# Patient Record
Sex: Female | Born: 2000 | Race: White | Hispanic: No | Marital: Married | Smoking: Never smoker
Health system: Southern US, Community
[De-identification: ages and names within clinical notes are randomized; demographics above are authoritative.]

## PROBLEM LIST (undated history)

## (undated) DIAGNOSIS — E559 Vitamin D deficiency, unspecified: Secondary | ICD-10-CM

## (undated) DIAGNOSIS — F909 Attention-deficit hyperactivity disorder, unspecified type: Secondary | ICD-10-CM

## (undated) DIAGNOSIS — K429 Umbilical hernia without obstruction or gangrene: Secondary | ICD-10-CM

## (undated) DIAGNOSIS — O1490 Unspecified pre-eclampsia, unspecified trimester: Secondary | ICD-10-CM

## (undated) DIAGNOSIS — G43109 Migraine with aura, not intractable, without status migrainosus: Secondary | ICD-10-CM

## (undated) DIAGNOSIS — D649 Anemia, unspecified: Secondary | ICD-10-CM

## (undated) HISTORY — DX: Unspecified pre-eclampsia, unspecified trimester: O14.90

## (undated) HISTORY — DX: Attention-deficit hyperactivity disorder, unspecified type: F90.9

## (undated) HISTORY — DX: Migraine with aura, not intractable, without status migrainosus: G43.109

---
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
## 2018-02-24 ENCOUNTER — Other Ambulatory Visit: Payer: Self-pay | Admitting: Obstetrics and Gynecology

## 2018-02-24 DIAGNOSIS — Z30016 Encounter for initial prescription of transdermal patch hormonal contraceptive device: Secondary | ICD-10-CM

## 2018-03-06 ENCOUNTER — Encounter: Payer: Self-pay | Admitting: Podiatry

## 2018-03-06 ENCOUNTER — Ambulatory Visit: Payer: BLUE CROSS/BLUE SHIELD | Admitting: Podiatry

## 2018-03-06 DIAGNOSIS — L600 Ingrowing nail: Secondary | ICD-10-CM

## 2018-03-06 MED ORDER — NEOMYCIN-POLYMYXIN-HC 1 % OT SOLN: OTIC | 1 refills | Status: DC

## 2018-03-06 NOTE — Patient Instructions (Signed)

## 2018-03-06 NOTE — Progress Notes (Signed)
  Subjective:  Patient ID: Karen Woodward, female    DOB: 04-15-2001,  MRN: [id number] HPI Chief Complaint  Patient presents with  . Ingrown Toenail    Patient presents today for ingrown toenail left hallux medial border x 1 month.  She reports her toe is sore, puffy and draining.  she has been soaking in Epson salt and cutting nail out for treatment    17 y.o. female presents with the above complaint.   ROS: Denies fever chills nausea vomiting muscle aches pains calf pain back pain chest pain shortness of breath.  Past Medical History:  Diagnosis Date  . ADHD (attention deficit hyperactivity disorder)    Past Surgical History:  Procedure Laterality Date  . TONSILLECTOMY AND ADENOIDECTOMY  06/2014    Current Outpatient Medications:  .  promethazine (PHENERGAN) 25 MG tablet, Take by mouth., Disp: , Rfl:  .  propranolol ER (INDERAL LA) 60 MG 24 hr capsule, Take by mouth., Disp: , Rfl:  .  NEOMYCIN-POLYMYXIN-HYDROCORTISONE (CORTISPORIN) 1 % SOLN OTIC solution, Apply 1-2 drops to toe bid after soaking, Disp: 10 mL, Rfl: 1  No Known Allergies Review of Systems Objective:  There were no vitals filed for this visit.  General: Well developed, nourished, in no acute distress, alert and oriented x3   Dermatological: Skin is warm, dry and supple bilateral. Nails x 10 are well maintained; remaining integument appears unremarkable at this time. There are no open sores, no preulcerative lesions, no rash or signs of infection present.  Vascular: Dorsalis Pedis artery and Posterior Tibial artery pedal pulses are 2/4 bilateral with immedate capillary fill time. Pedal hair growth present. No varicosities and no lower extremity edema present bilateral.   Neruologic: Grossly intact via light touch bilateral. Vibratory intact via tuning fork bilateral. Protective threshold with Semmes Wienstein monofilament intact to all pedal sites bilateral. Patellar and Achilles deep tendon reflexes 2+ bilateral.  No Babinski or clonus noted bilateral.   Musculoskeletal: No gross boney pedal deformities bilateral. No pain, crepitus, or limitation noted with foot and ankle range of motion bilateral. Muscular strength 5/5 in all groups tested bilateral.  Gait: Unassisted, Nonantalgic.    Radiographs:  None taken  Assessment & Plan:   Assessment: Ingrown toenail tibial and fibular border of the hallux left.  Plan: Matrixectomy's were performed to the hallux left after local anesthesia was administered.  Both tibiofibular border were excised and phenol was applied to the matrix to inhibit growth.  She was given both oral and written home-going instructions for the care and soaking of her toe as well as prescription for Cortisporin Otic to be applied twice daily after soaking.  I will follow-up with her in 2 weeks remember to ask how her trip was.     Max T. [place], [place]

## 2018-03-25 ENCOUNTER — Ambulatory Visit: Payer: Self-pay | Admitting: Podiatry

## 2018-05-13 ENCOUNTER — Ambulatory Visit (INDEPENDENT_AMBULATORY_CARE_PROVIDER_SITE_OTHER): Payer: BLUE CROSS/BLUE SHIELD | Admitting: Obstetrics & Gynecology

## 2018-05-13 ENCOUNTER — Encounter: Payer: Self-pay | Admitting: Obstetrics & Gynecology

## 2018-05-13 VITALS — BP 100/60 | Ht 63.0 in | Wt 213.0 lb

## 2018-05-13 DIAGNOSIS — N926 Irregular menstruation, unspecified: Secondary | ICD-10-CM | POA: Diagnosis not present

## 2018-05-13 MED ORDER — NORETHIN-ETH ESTRAD-FE BIPHAS 1 MG-10 MCG / 10 MCG PO TABS: 1.0000 | ORAL_TABLET | Freq: Every day | ORAL | 3 refills | Status: DC

## 2018-05-13 NOTE — Patient Instructions (Signed)
Estradiol; Norethindrone Acetate tablets What is this medicine? ESTRADIOL; NORETHINDRONE (es tra DYE ole; nor eth IN drone) is used as hormone replacement in menopausal women who still have their uterus. This product helps to treat hot flashes and prevent osteoporosis (weak bones). This medicine may be used for other purposes; ask your health care provider or pharmacist if you have questions. COMMON BRAND NAME(S): Activella, Danie ChandlerAmabelz, Lopreeza, Mimvey, Mimvey Lo What should I tell my health care provider before I take this medicine? They need to know if you have any of these conditions: -blood vessel disease or blood clots -breast, cervical, endometrial, or uterine cancer -diabetes -endometriosis -fibroids -gallbladder disease -heart disease or recent heart attack -high blood cholesterol -high blood pressure -high level of calcium in the blood -hysterectomy -kidney disease -liver disease -mental depression -migraine headaches -porphyria -stroke -systemic lupus erythematosus (SLE) -tobacco smoker -vaginal bleeding -an unusual or allergic reaction to estrogens, progestins, other medicines, foods, dyes, or preservatives -pregnant or trying to get pregnant -breast-feeding How should I use this medicine? Take this medicine by mouth with a drink of water. You may take this medicine with food. Follow the directions on the prescription label. You will take one tablet daily at roughly the same time each day. Do not take your medicine more often than directed. A patient package insert for the product will be given with each prescription and refill. Read this sheet carefully each time. The sheet may change frequently. Talk to your pediatrician regarding the use of this medicine in children. Special care may be needed. Overdosage: If you think you have taken too much of this medicine contact a poison control center or emergency room at once. NOTE: This medicine is only for you. Do not share  this medicine with others. What if I miss a dose? If you miss a dose, take it as soon as you can. If it is almost time for your next dose, take only that dose. Do not take double or extra doses. What may interact with this medicine? Do not take this medicine with any of the following medications: -aromatase inhibitors like aminoglutethimide, anastrozole, exemestane, letrozole, testolactone This medicine may also interact with the following medications: -barbiturates, such as phenobarbital -benzodiazepines -bosentan -bromocriptine -carbamazepine -cimetidine -cyclosporine -dantrolene -grapefruit juice -griseofulvin -hydrocortisone, cortisone, or prednisolone -isoniazid (INH) -medications for diabetes -methotrexate -mineral oil -phenytoin -raloxifene -rifabutin, rifampin, or rifapentine -tamoxifen -thyroid hormones -topiramate -tricyclic antidepressants -warfarin This list may not describe all possible interactions. Give your health care provider a list of all the medicines, herbs, non-prescription drugs, or dietary supplements you use. Also tell them if you smoke, drink alcohol, or use illegal drugs. Some items may interact with your medicine. What should I watch for while using this medicine? Visit your health care professional for regular checks on your progress. You should have a complete check-up every 6 months. You will need a regular breast and pelvic exam. You should also discuss the need for regular mammograms with your health care professional, and follow his or her guidelines. This medicine can make your body retain fluid, making your fingers, hands, or ankles swell. Your blood pressure can go up. Contact your doctor or health care professional if you feel you are retaining fluid. If you have any reason to think you are pregnant; stop taking this medicine at once and contact your doctor or health care professional. Tobacco smoking increases the risk of getting a blood clot  or having a stroke, especially if you are more than 19 years  old. You are strongly advised not to smoke. If you wear contact lenses and notice visual changes, or if the lenses begin to feel uncomfortable, consult your eye care specialist. If you are going to have elective surgery, you may need to stop taking this medicine beforehand. Consult your health care professional for advice prior to scheduling the surgery. What side effects may I notice from receiving this medicine? Side effects that you should report to your doctor or health care professional as soon as possible: -allergic reactions like skin rash, itching or hives, swelling of the face, lips, or tongue -breast tissue changes or discharge -changes in vision -chest pain -confusion, trouble speaking or understanding -dark urine -general ill feeling or flu-like symptoms -light-colored stools -nausea, vomiting -pain, swelling, warmth in the leg -right upper belly pain -severe headaches -shortness of breath -sudden numbness or weakness of the face, arm or leg -trouble walking, dizziness, loss of balance or coordination -unusual vaginal bleeding -yellowing of the eyes or skin Side effects that usually do not require medical attention (report to your doctor or health care professional if they continue or are bothersome): -acne -brown spots on the face -change in appetite -change in sexual desire -depressed mood or mood swings -fluid retention and swelling -stomach cramps or bloating -unusually weak or tired -weight gain This list may not describe all possible side effects. Call your doctor for medical advice about side effects. You may report side effects to FDA at 1-800-FDA-1088. Where should I keep my medicine? Keep out of the reach of children. Store at room temperature between 15 and 30 degrees C (59 and 86 degrees F). Throw away any unused medicine after the expiration date. NOTE: This sheet is a summary. It may not cover  all possible information. If you have questions about this medicine, talk to your doctor, pharmacist, or health care provider.  2018 Elsevier/Gold Standard (2008-05-14 14:02:48)  

## 2018-05-13 NOTE — Progress Notes (Signed)
  HPI:      Ms. Karen KarvonenSidney Piper is a 17 y.o. G0P0000 who LMP was Patient's last menstrual period was 04/22/2018., presents today for a problem visit.  She complains of menometrorrhagia that  began several months ago and its severity is described as moderate.  She has a period every 2 weeks to 8 weeks, sometimes several in a roq, when not on birth control pills.  Nexplanon also made her bleed all the time. They are associated with moderate menstrual cramping.  She has used the following for attempts at control: maxi pad.  Also has unusual back swelling at times w her periods.  Previous evaluation: none. Prior Diagnosis: dysfunctional uterine bleeding. Previous Treatment: Prog only pill for 3 mos, helped (stopped 3 mos ago).  She is single partner, contraception - none.  Hx of STDs: none. She is premenopausal.  PMHx: She  has a past medical history of ADHD (attention deficit hyperactivity disorder). Also,  has a past surgical history that includes Tonsillectomy and adenoidectomy (06/2014)., family history includes Diabetes in her maternal grandfather; Hypertension in her mother.,  reports that she has never smoked. She has never used smokeless tobacco. She reports that she does not drink alcohol or use drugs.  She  Current Outpatient Medications:  .  NEOMYCIN-POLYMYXIN-HYDROCORTISONE (CORTISPORIN) 1 % SOLN OTIC solution, Apply 1-2 drops to toe bid after soaking (Patient not taking: Reported on 05/13/2018), Disp: 10 mL, Rfl: 1 .  promethazine (PHENERGAN) 25 MG tablet, Take by mouth., Disp: , Rfl:  .  propranolol ER (INDERAL LA) 60 MG 24 hr capsule, Take by mouth., Disp: , Rfl:   Also, has No Known Allergies.  Review of Systems  Constitutional: Negative for chills, fever and malaise/fatigue.  HENT: Negative for congestion, sinus pain and sore throat.   Eyes: Negative for blurred vision and pain.  Respiratory: Negative for cough and wheezing.   Cardiovascular: Negative for chest pain and leg  swelling.  Gastrointestinal: Negative for abdominal pain, constipation, diarrhea, heartburn, nausea and vomiting.  Genitourinary: Negative for dysuria, frequency, hematuria and urgency.  Musculoskeletal: Negative for back pain, joint pain, myalgias and neck pain.  Skin: Negative for itching and rash.  Neurological: Negative for dizziness, tremors and weakness.  Endo/Heme/Allergies: Does not bruise/bleed easily.  Psychiatric/Behavioral: Negative for depression. The patient is not nervous/anxious and does not have insomnia.    Objective: BP 100/60   Ht 5\' 3"  (1.6 m)   Wt 213 lb (96.6 kg)   LMP 04/22/2018   BMI 37.73 kg/m  Physical Exam  Constitutional: She is oriented to person, place, and time. She appears well-developed and well-nourished. No distress.  Musculoskeletal: Normal range of motion.  Neurological: She is alert and oriented to person, place, and time.  Skin: Skin is warm and dry.  Psychiatric: She has a normal mood and affect.  Vitals reviewed.  ASSESSMENT/PLAN:  dysfunctional uterine bleeding   Irregular menses    -  Primary   Relevant Orders   US PELVIS TRANSVANGINAL NON-OB (TV ONLY)  Plan OCP for treatment    Alternatives discussed US to assess structural Unsure as to low back edema etiology   Annamarie MajorPaul Kelisha Dall, MD, Merlinda FrederickFACOG [facility], [facility] 05/13/2018  4:46 PM

## 2018-05-16 ENCOUNTER — Encounter: Payer: Self-pay | Admitting: Obstetrics & Gynecology

## 2018-05-16 ENCOUNTER — Ambulatory Visit (INDEPENDENT_AMBULATORY_CARE_PROVIDER_SITE_OTHER): Payer: BLUE CROSS/BLUE SHIELD | Admitting: Obstetrics & Gynecology

## 2018-05-16 ENCOUNTER — Ambulatory Visit (INDEPENDENT_AMBULATORY_CARE_PROVIDER_SITE_OTHER): Payer: BLUE CROSS/BLUE SHIELD

## 2018-05-16 VITALS — BP 110/70 | Ht 63.0 in | Wt 209.0 lb

## 2018-05-16 DIAGNOSIS — R609 Edema, unspecified: Secondary | ICD-10-CM | POA: Diagnosis not present

## 2018-05-16 DIAGNOSIS — N921 Excessive and frequent menstruation with irregular cycle: Secondary | ICD-10-CM | POA: Diagnosis not present

## 2018-05-16 DIAGNOSIS — N926 Irregular menstruation, unspecified: Secondary | ICD-10-CM | POA: Diagnosis not present

## 2018-05-16 DIAGNOSIS — N946 Dysmenorrhea, unspecified: Secondary | ICD-10-CM

## 2018-05-16 HISTORY — DX: Excessive and frequent menstruation with irregular cycle: N92.1

## 2018-05-16 HISTORY — DX: Dysmenorrhea, unspecified: N94.6

## 2018-05-16 HISTORY — DX: Edema, unspecified: R60.9

## 2018-05-16 NOTE — Progress Notes (Signed)
  HPI: Pt has heavy and painful periods and also back swelling monthly.  She complains of menometrorrhagia that  began several months ago and its severity is described as moderate.  She has a period every 2 weeks to 8 weeks, sometimes several in a row, when not on birth control pills.  Nexplanon also made her bleed all the time. They are associated with moderate menstrual cramping.  She has used the following for attempts at control: maxi pad.  Also has unusual back swelling at times w her periods.  Previous evaluation: none. Prior Diagnosis: dysfunctional uterine bleeding. Previous Treatment: Prog only pill for 3 mos, helped (stopped 3 mos ago).  Ultrasound demonstrates no masses seen, some thickening assoc w adenomyosis, no cysts  PMHx: She  has a past medical history of ADHD (attention deficit hyperactivity disorder). Also,  has a past surgical history that includes Tonsillectomy and adenoidectomy (06/2014)., family history includes Diabetes in her maternal grandfather; Hypertension in her mother.,  reports that she has never smoked. She has never used smokeless tobacco. She reports that she does not drink alcohol or use drugs.  She has a current medication list which includes the following prescription(s): norethindrone-ethinyl estradiol-fe biphas, propranolol er, neomycin-polymyxin-hydrocortisone, and promethazine. Also, has No Known Allergies.  Review of Systems  All other systems reviewed and are negative.  Objective: BP 110/70   Ht 5\' 3"  (1.6 m)   Wt 209 lb (94.8 kg)   LMP 04/22/2018   BMI 37.02 kg/m   Physical examination Constitutional NAD, Conversant  Skin No rashes, lesions or ulceration.   Extremities: Moves all appropriately.  Normal ROM for age. No lymphadenopathy.  Neuro: Grossly intact  Psych: Oriented to PPT.  Normal mood. Normal affect.   Assessment:  Menometrorrhagia  Soft tissue swelling of back  Dysmenorrhea  Plan OCP use and see if some/all sx's  improve  A total of 15 minutes were spent face-to-face with the patient during this encounter and over half of that time dealt with counseling and coordination of care.  Annamarie MajorPaul Eron Staat, MD, Merlinda FrederickFACOG [facility], [facility] 05/16/2018  2:35 PM

## 2018-08-25 ENCOUNTER — Emergency Department
Admission: EM | Admit: 2018-08-25 | Discharge: 2018-08-25 | Disposition: A | Discharge status: Home / Self Care | Payer: BLUE CROSS/BLUE SHIELD | Attending: Student in an Organized Health Care Education/Training Program | Admitting: Student in an Organized Health Care Education/Training Program

## 2018-08-25 ENCOUNTER — Emergency Department: Payer: BLUE CROSS/BLUE SHIELD

## 2018-08-25 ENCOUNTER — Other Ambulatory Visit: Payer: Self-pay

## 2018-08-25 DIAGNOSIS — Z23 Encounter for immunization: Secondary | ICD-10-CM | POA: Diagnosis not present

## 2018-08-25 DIAGNOSIS — N939 Abnormal uterine and vaginal bleeding, unspecified: Secondary | ICD-10-CM | POA: Diagnosis present

## 2018-08-25 DIAGNOSIS — Z2913 Encounter for prophylactic Rho(D) immune globulin: Secondary | ICD-10-CM | POA: Diagnosis not present

## 2018-08-25 LAB — CBC
HEMATOCRIT: 41.8 % (ref 36.0–46.0)
HEMOGLOBIN: 13.1 g/dL (ref 12.0–15.0)
MCH: 24.9 pg — AB (ref 26.0–34.0)
MCHC: 31.3 g/dL (ref 30.0–36.0)
MCV: 79.5 fL — ABNORMAL LOW (ref 80.0–100.0)
Platelets: 391 10*3/uL (ref 150–400)
RBC: 5.26 MIL/uL — ABNORMAL HIGH (ref 3.87–5.11)
RDW: 14.6 % (ref 11.5–15.5)
WBC: 8.1 10*3/uL (ref 4.0–10.5)
nRBC: 0 % (ref 0.0–0.2)

## 2018-08-25 LAB — BASIC METABOLIC PANEL
Anion gap: 10 (ref 5–15)
BUN: 12 mg/dL (ref 6–20)
CHLORIDE: 106 mmol/L (ref 98–111)
CO2: 23 mmol/L (ref 22–32)
Calcium: 9.8 mg/dL (ref 8.9–10.3)
Creatinine, Ser: 0.58 mg/dL (ref 0.44–1.00)
GFR calc Af Amer: >60 mL/min (ref >60)
GFR calc non Af Amer: >60 mL/min (ref >60)
Glucose, Bld: 109 mg/dL — ABNORMAL HIGH (ref 70–99)
Potassium: 4 mmol/L (ref 3.5–5.1)
Sodium: 139 mmol/L (ref 135–145)

## 2018-08-25 LAB — TYPE AND SCREEN
ABO/RH(D): O NEG
Antibody Screen: NEGATIVE

## 2018-08-25 LAB — HCG, QUANTITATIVE, PREGNANCY: HCG, BETA CHAIN, QUANT, S: 2 m[IU]/mL (ref <5)

## 2018-08-25 MED ORDER — RHO D IMMUNE GLOBULIN 1500 UNIT/2ML IJ SOSY
300.0000 ug | PREFILLED_SYRINGE | Freq: Once | INTRAMUSCULAR | Status: AC
Start: 2018-08-25 — End: 2018-08-25
  Administered 2018-08-25: 300 ug via INTRAVENOUS
  Filled 2018-08-25: qty 2

## 2018-08-25 NOTE — ED Notes (Signed)
Recollect type and screen sent

## 2018-08-25 NOTE — ED Notes (Signed)
RhoGam given IM in left arm. MD made aware.

## 2018-08-25 NOTE — ED Notes (Signed)
Patient transported to Ultrasound 

## 2018-08-25 NOTE — ED Triage Notes (Addendum)
Pt states about [redacted] weeks pregnant. States starting bleeding at 10am today. Pt states she's had same pad on since attending a baby shower this morning. A&O, ambulatory. 1st pregnancy. LMP 07/22/2018

## 2018-08-25 NOTE — ED Provider Notes (Signed)
[facility] Emergency Department Provider Note    First MD Initiated Contact with Patient 08/25/18 1655     (approximate)  I have reviewed the triage vital signs and the nursing notes.   HISTORY  Chief Complaint Vaginal Bleeding    HPI Karen Woodward is a 18 y.o. female no significant past medical history presents for vaginal bleeding and cramping.  States that she is roughly [redacted] weeks pregnant by LMP.  Denies any history of bleeding disorders.  States that she is had the same pad on for the past 12 hours.  Has not passed any clots.  States that she took "6 "pregnancy test this past week which were all positive.  Denies any fevers.  No trauma.    Past Medical History:  Diagnosis Date  . ADHD (attention deficit hyperactivity disorder)    Family History  Problem Relation Age of Onset  . Hypertension Mother   . Diabetes Maternal Grandfather    Past Surgical History:  Procedure Laterality Date  . TONSILLECTOMY AND ADENOIDECTOMY  06/2014   Patient Active Problem List   Diagnosis Date Noted  . Menometrorrhagia 05/16/2018  . Soft tissue swelling of back 05/16/2018  . Dysmenorrhea 05/16/2018      Prior to Admission medications   Medication Sig Start Date End Date Taking? Authorizing Provider  NEOMYCIN-POLYMYXIN-HYDROCORTISONE (CORTISPORIN) 1 % SOLN OTIC solution Apply 1-2 drops to toe bid after soaking Patient not taking: Reported on 05/13/2018 03/06/18   Hyatt, Max T, DPM  Norethindrone-Ethinyl Estradiol-Fe Biphas (LO LOESTRIN FE) 1 MG-10 MCG / 10 MCG tablet Take 1 tablet by mouth daily. 05/13/18 08/05/18  Nadara Mustard, MD  promethazine (PHENERGAN) 25 MG tablet Take by mouth. 08/02/17   [provider]  propranolol ER (INDERAL LA) 60 MG 24 hr capsule Take by mouth. 08/12/17 08/12/18  [provider]    Allergies Patient has no known allergies.    Social History Social History   Tobacco Use  . Smoking status: Never Smoker  .  Smokeless tobacco: Never Used  Substance Use Topics  . Alcohol use: No  . Drug use: No    Review of Systems Patient denies headaches, rhinorrhea, blurry vision, numbness, shortness of breath, chest pain, edema, cough, abdominal pain, nausea, vomiting, diarrhea, dysuria, fevers, rashes or hallucinations unless otherwise stated above in HPI. ____________________________________________   PHYSICAL EXAM:  VITAL SIGNS: Vitals:   08/25/18 1519  BP: 137/87  Pulse: 81  Resp: 18  Temp: 98.8 F (37.1 C)  SpO2: 99%    Constitutional: Alert and oriented.  Eyes: Conjunctivae are normal.  Head: Atraumatic. Nose: No congestion/rhinnorhea. Mouth/Throat: Mucous membranes are moist.   Neck: No stridor. Painless ROM.  Cardiovascular: Normal rate, regular rhythm. Grossly normal heart sounds.  Good peripheral circulation. Respiratory: Normal respiratory effort.  No retractions. Lungs CTAB. Gastrointestinal: Soft and nontender. No distention. No abdominal bruits. No CVA tenderness. Genitourinary:  Musculoskeletal: No lower extremity tenderness nor edema.  No joint effusions. Neurologic:  Normal speech and language. No gross focal neurologic deficits are appreciated. No facial droop Skin:  Skin is warm, dry and intact. No rash noted. Psychiatric: Mood and affect are normal. Speech and behavior are normal.  ____________________________________________   LABS (all labs ordered are listed, but only abnormal results are displayed)  Results for orders placed or performed during the hospital encounter of 08/25/18 (from the past 24 hour(s))  CBC     Status: Abnormal   Collection Time: 08/25/18  3:24 PM  Result Value Ref Range   WBC 8.1 4.0 - 10.5 K/uL   RBC 5.26 (H) 3.87 - 5.11 MIL/uL   Hemoglobin 13.1 12.0 - 15.0 g/dL   HCT 60.7 37.1 - 06.2 %   MCV 79.5 (L) 80.0 - 100.0 fL   MCH 24.9 (L) 26.0 - 34.0 pg   MCHC 31.3 30.0 - 36.0 g/dL   RDW 69.4 85.4 - 62.7 %   Platelets 391 150 - 400 K/uL    nRBC 0.0 0.0 - 0.2 %  Basic metabolic panel     Status: Abnormal   Collection Time: 08/25/18  3:24 PM  Result Value Ref Range   Sodium 139 135 - 145 mmol/L   Potassium 4.0 3.5 - 5.1 mmol/L   Chloride 106 98 - 111 mmol/L   CO2 23 22 - 32 mmol/L   Glucose, Bld 109 (H) 70 - 99 mg/dL   BUN 12 6 - 20 mg/dL   Creatinine, Ser 0.35 0.44 - 1.00 mg/dL   Calcium 9.8 8.9 - 00.9 mg/dL   GFR calc non Af Amer >60 >60 mL/min   GFR calc Af Amer >60 >60 mL/min   Anion gap 10 5 - 15  hCG, quantitative, pregnancy     Status: None   Collection Time: 08/25/18  3:24 PM  Result Value Ref Range   hCG, Beta Chain, Quant, S 2 <5 mIU/mL   ____________________________________________ ____________________________________________  RADIOLOGY  I personally reviewed all radiographic images ordered to evaluate for the above acute complaints and reviewed radiology reports and findings.  These findings were personally discussed with the patient.  Please see medical record for radiology report.  ____________________________________________   PROCEDURES  Procedure(s) performed:  Procedures    Critical Care performed: no ____________________________________________   INITIAL IMPRESSION / ASSESSMENT AND PLAN / ED COURSE  Pertinent labs & imaging results that were available during my care of the patient were reviewed by me and considered in my medical decision making (see chart for details).   DDX: Ectopic, AUB, DUB, miscarriage, menses   Karen Woodward is a 18 y.o. who presents to the ED with vaginal bleeding.  Seems to be fairly minimal bleeding.  She is afebrile hemodynamically stable.  No evidence of acute blood loss anemia.  Her pregnancy level here is essentially negative.  Ultrasound ordered to rule out ectopic shows no evidence of tubal pregnancy.  Uncertain as we have no previous quant's to know if she was truly [redacted] weeks pregnant.  I discussed the case with Dr. Bonney Aid regarding need for RhoGam.  Is  determined that is in her best interest in the safest thing to just give her RhoGam due to the uncertainty.  Patient will be stable for patient follow-up.      As part of my medical decision making, I reviewed the following data within the electronic MEDICAL RECORD NUMBER Nursing notes reviewed and incorporated, Labs reviewed, notes from prior ED visits and [place] Controlled Substance Database   ____________________________________________   FINAL CLINICAL IMPRESSION(S) / ED DIAGNOSES  Final diagnoses:  Vaginal bleeding  Abnormal uterine bleeding      NEW MEDICATIONS STARTED DURING THIS VISIT:  New Prescriptions   No medications on file     Note:  This document was prepared using Dragon voice recognition software and may include unintentional dictation errors.    Willy Eddy, MD 08/25/18 [facility] [employer]s

## 2018-08-26 LAB — RHOGAM INJECTION: UNIT DIVISION: 0

## 2018-08-27 ENCOUNTER — Encounter: Payer: Self-pay | Admitting: Obstetrics and Gynecology

## 2018-08-27 ENCOUNTER — Other Ambulatory Visit: Payer: Self-pay

## 2018-08-27 ENCOUNTER — Ambulatory Visit (INDEPENDENT_AMBULATORY_CARE_PROVIDER_SITE_OTHER): Payer: BLUE CROSS/BLUE SHIELD | Admitting: Obstetrics and Gynecology

## 2018-08-27 VITALS — BP 122/70 | HR 105 | Ht 63.0 in | Wt 210.0 lb

## 2018-08-27 DIAGNOSIS — O0281 Inappropriate change in quantitative human chorionic gonadotropin (hCG) in early pregnancy: Secondary | ICD-10-CM

## 2018-08-27 NOTE — Progress Notes (Signed)
Obstetric Problem Visit    Chief Complaint:  Chief Complaint  Patient presents with   ER follow up    vaginal bleeding/ possible miscarriage    History of Present Illness: Patient is a 18 y.o. G1P0000 Unknown presenting for first trimester bleeding.  The onset of bleeding was 2 days ago.  Is bleeding equal to or greater than normal menstrual flow:  Was not slowed down Any recent trauma:  No History of prior miscarriage:  No Prior ultrasound demonstrating IUP: none.  Prior ultrasound demonstrating viable IUP:  No Prior Serum HCG:  Yes <2 on 3/15 Rh status: Negative s/p rhogam 3/15  Review of Systems: Review of Systems  Constitutional: Negative.   Gastrointestinal: Negative.   Genitourinary: Negative.     Past Medical History:  Past Medical History:  Diagnosis Date   ADHD (attention deficit hyperactivity disorder)     Past Surgical History:  Past Surgical History:  Procedure Laterality Date   TONSILLECTOMY AND ADENOIDECTOMY  06/2014    Obstetric History: G1P0000  Family History:  Family History  Problem Relation Age of Onset   Hypertension Mother    Diabetes Maternal Grandfather     Social History:  Social History   Socioeconomic History   Marital status: Single    Spouse name: Not on file   Number of children: 0   Years of education: 13   Highest education level: Not on file  Occupational History   Occupation: EMT  Social Needs   Physicist, medical strain: Not on file   Food insecurity:    Worry: Not on file    Inability: Not on file   Transportation needs:    Medical: Not on file    Non-medical: Not on file  Tobacco Use   Smoking status: Never Smoker   Smokeless tobacco: Never Used  Substance and Sexual Activity   Alcohol use: No   Drug use: No   Sexual activity: Yes    Birth control/protection: None  Lifestyle   Physical activity:    Days per week: Not on file    Minutes per session: Not on file   Stress: Not on  file  Relationships   Social connections:    Talks on phone: Not on file    Gets together: Not on file    Attends religious service: Not on file    Active member of club or organization: Not on file    Attends meetings of clubs or organizations: Not on file    Relationship status: Not on file   Intimate partner violence:    Fear of current or ex partner: Not on file    Emotionally abused: Not on file    Physically abused: Not on file    Forced sexual activity: Not on file  Other Topics Concern   Not on file  Social History Narrative   Not on file    Allergies:  No Known Allergies  Medications: Prior to Admission medications   Not on File    Physical Exam Vitals: Blood pressure 122/70, pulse (!) 105, height 5\' 3"  (1.6 m), weight 210 lb (95.3 kg), last menstrual period 07/22/2018, unknown if currently breastfeeding. General: NAD, well nourished, appears stated age HEENT: normocephalic, anicteric Pulmonary: No increased work of breathing, Neurologic: Grossly intact Psychiatric: mood appropriate, affect full  Assessment: 18 y.o. G1P0000 Unknown presenting for evaluation of first trimester vaginal bleeding  Plan: Problem List Items Addressed This Visit    None    Visit Diagnoses  Chemical pregnancy    -  Primary      1) First trimester bleeding - incidence and clinical course of first trimester bleeding is discussed in detail with the patient today.  Approximately 1/3 of pregnancies ending in live births experienced 1st trimester bleeding.  The amount of bleeding is variable and not necessarily predictive of outcome.  Sources may be cervical or uterine.  Subchorionic hemorrhages are a frequent concurrent findings on ultrasound and are followed expectantly.  These often absorb or regress spontaneously although risk for expansion and further disruption of the utero-placental interface leading to miscarriage is possible.  There is no clearly documented benefit to limiting  or modifying activity and sexual intercourse in altering clinic course of 1st trimester bleeding.   - given positive home UPT and subsequent bleeding with negative HCG and ultrasound treated as chemical pregnancy vs completed abortion.   -  Condolences were offered to the patient and her family.  I stressed that while emotionally difficult, that this did not occur because of an actions or inactions by the patient.  Somewhere between 10-20% of identified first trimester pregnancies will unfortunately end in miscarriage.  Given this relatively high incidence rate, further diagnostic testing such as chromosome analysis is generally not clinically relevant nor recommended.  Although the chromosomal abnormalities have been implicated at rates as high as 70% in some studies, these are generally random and do not infer and increased risk of recurrence with subsequent pregnancies.  However, 3 or more consecutive first trimester losses are relatively uncommon, and these patient generally do benefit from additional work up to determine a potential modifiable etiology.    2) Ultrasound from ED reviewed  3) The patient is Rh negative, rhogam is therefore  indicated and was given at ED given unsure of gestational age.  4) Routine bleeding precautions were discussed with the patient prior the conclusion of today's visit.    Vena Austria, MD, [facility], [facility] 08/28/2018, 9:48 AM

## 2018-10-25 ENCOUNTER — Other Ambulatory Visit: Payer: Self-pay

## 2018-10-25 ENCOUNTER — Encounter: Payer: BLUE CROSS/BLUE SHIELD | Admitting: Obstetrics and Gynecology

## 2018-11-01 ENCOUNTER — Other Ambulatory Visit: Payer: Self-pay

## 2018-11-01 ENCOUNTER — Encounter: Payer: Self-pay | Admitting: Obstetrics and Gynecology

## 2018-11-01 ENCOUNTER — Ambulatory Visit (INDEPENDENT_AMBULATORY_CARE_PROVIDER_SITE_OTHER): Payer: Managed Care, Other (non HMO) | Admitting: Obstetrics and Gynecology

## 2018-11-01 DIAGNOSIS — O3680X0 Pregnancy with inconclusive fetal viability, not applicable or unspecified: Secondary | ICD-10-CM

## 2018-11-01 DIAGNOSIS — Z3403 Encounter for supervision of normal first pregnancy, third trimester: Secondary | ICD-10-CM | POA: Insufficient documentation

## 2018-11-01 DIAGNOSIS — Z3A01 Less than 8 weeks gestation of pregnancy: Secondary | ICD-10-CM

## 2018-11-01 DIAGNOSIS — O09291 Supervision of pregnancy with other poor reproductive or obstetric history, first trimester: Secondary | ICD-10-CM

## 2018-11-01 DIAGNOSIS — Z3401 Encounter for supervision of normal first pregnancy, first trimester: Secondary | ICD-10-CM

## 2018-11-01 DIAGNOSIS — Z8759 Personal history of other complications of pregnancy, childbirth and the puerperium: Secondary | ICD-10-CM

## 2018-11-01 NOTE — Progress Notes (Signed)
I connected with Karen Woodward on 11/01/18 at  1:30 PM EDT by telephone and verified that I am speaking with the correct person using two identifiers.   I discussed the limitations, risks, security and privacy concerns of performing an evaluation and management service by telephone and the availability of in person appointments. I also discussed with the patient that there may be a patient responsible charge related to this service. The patient expressed understanding and agreed to proceed.  The patient was at home I spoke with the patient from my workstation phone The names of people involved in this encounter were: Karen Woodward , and Vena Austria  New Obstetric Patient H&P    Chief Complaint: "Desires prenatal care"   History of Present Illness: Patient is a 18 y.o. G2P0010 Not Hispanic or Latino female, presents with amenorrhea and positive home pregnancy test. Patient's last menstrual period was 09/26/2018 (exact date). and based on her  LMP, her EDD is Estimated Date of Delivery: 07/03/19 and her EGA is [redacted]w[redacted]d.   She had a urine pregnancy test which was positive 2 weeks ago.Since her LMP she claims she has experienced nausea, bloating, fatigue, breast tenderness. She denies vaginal bleeding. Her past medical history is noncontributory. Her prior pregnancies are notable for none  Since her LMP, she admits to the use of tobacco products  Yes  Vaping.  There are cats in the home in the home  no She admits close contact with children on a regular basis  no  She has had chicken pox in the past yes She has had Tuberculosis exposures, symptoms, or previously tested positive for TB   no Current or past history of domestic violence. no  Genetic Screening/Teratology Counseling: (Includes patient, baby's father, or anyone in either family with:)   1. Patient's age >/= 69 at [facility]  no 2. Thalassemia ([place], [place], Mediterranean, or Asian background): MCV<80  no 3. Neural tube defect  (meningomyelocele, spina bifida, anencephaly)  no 4. Congenital heart defect  no  5. Down syndrome  no 6. Tay-Sachs (Jewish, [place]  no 7. Canavan's Disease  no 8. Sickle cell disease or trait (African)  no  9. Hemophilia or other blood disorders  no  10. Muscular dystrophy  no  11. Cystic fibrosis  no  12. Huntington's Chorea  no  13. Mental retardation/autism  no 14. Other inherited genetic or chromosomal disorder  no 15. Maternal metabolic disorder (DM, PKU, etc)  no 16. Patient or FOB with a child with a birth defect not listed above no  16a. Patient or FOB with a birth defect themselves no 17. Recurrent pregnancy loss, or stillbirth  no  18. Any medications since LMP other than prenatal vitamins (include vitamins, supplements, OTC meds, drugs, alcohol)  no 19. Any other genetic/environmental exposure to discuss  no  Infection History:   1. Lives with someone with TB or TB exposed  no  2. Patient or partner has history of genital herpes  no 3. Rash or viral illness since LMP  no 4. History of STI (GC, CT, HPV, syphilis, HIV)  no 5. History of recent travel :  no  Other pertinent information:  no     Review of Systems:10 point review of systems negative unless otherwise noted in HPI  Past Medical History:  History reviewed. No pertinent past medical history.  Past Surgical History:  Past Surgical History:  Procedure Laterality Date  . TONSILLECTOMY AND ADENOIDECTOMY  06/2014    Gynecologic History:  Patient's last menstrual period was 09/26/2018 (exact date).  Obstetric History: G2P0010  Family History:  Family History  Problem Relation Age of Onset  . Hypertension Mother   . Diabetes Maternal Grandfather     Social History:  Social History   Socioeconomic History  . Marital status: Single    Spouse name: Not on file  . Number of children: 0  . Years of education: 2013  . Highest education level: Not on file  Occupational History  . Occupation:  EMT  Social Needs  . Financial resource strain: Not on file  . Food insecurity:    Worry: Not on file    Inability: Not on file  . Transportation needs:    Medical: Not on file    Non-medical: Not on file  Tobacco Use  . Smoking status: Never Smoker  . Smokeless tobacco: Never Used  Substance and Sexual Activity  . Alcohol use: No  . Drug use: No  . Sexual activity: Yes    Birth control/protection: None  Lifestyle  . Physical activity:    Days per week: Not on file    Minutes per session: Not on file  . Stress: Not on file  Relationships  . Social connections:    Talks on phone: Not on file    Gets together: Not on file    Attends religious service: Not on file    Active member of club or organization: Not on file    Attends meetings of clubs or organizations: Not on file    Relationship status: Not on file  . Intimate partner violence:    Fear of current or ex partner: Not on file    Emotionally abused: Not on file    Physically abused: Not on file    Forced sexual activity: Not on file  Other Topics Concern  . Not on file  Social History Narrative  . Not on file    Allergies:  No Known Allergies  Medications: Prior to Admission medications   Not on File    Physical Exam Last menstrual period 09/26/2018, unknown if currently breastfeeding.  No physical exam as this was a remote telephone visit to promote social distancing during the current COVID-19 Pandemic  Assessment: 18 y.o. G2P0010 at 6274w1d presenting to initiate prenatal care  Plan: 1) Avoid alcoholic beverages. 2) Patient encouraged not to smoke.  3) Discontinue the use of all non-medicinal drugs and chemicals.  4) Take prenatal vitamins daily.  5) Nutrition, food safety (fish, cheese advisories, and high nitrite foods) and exercise discussed. 6) Hospital and practice style discussed with cross coverage system.  7) Genetic Screening, such as with 1st Trimester Screening, cell free fetal DNA, AFP  testing, and Ultrasound, as well as with amniocentesis and CVS as appropriate, is discussed with patient. At the conclusion of today's visit patient undecided genetic testing 8) Patient is asked about travel to areas at risk for the [place] virus, and counseled to avoid travel and exposure to mosquitoes or sexual partners who may have themselves been exposed to the virus. Testing is discussed, and will be ordered as appropriate.  9) Telephone time 14:45 minutes 10) Will trend HCG given recent chemical pregnancy 11) No follow-ups on file.  Vena AustriaAndreas Briceson Broadwater, MD, Merlinda FrederickFACOG [facility], [facility] 11/01/2018, 1:45 PM

## 2018-11-05 ENCOUNTER — Other Ambulatory Visit: Payer: Self-pay

## 2018-11-05 ENCOUNTER — Other Ambulatory Visit: Payer: Managed Care, Other (non HMO)

## 2018-11-05 DIAGNOSIS — Z8759 Personal history of other complications of pregnancy, childbirth and the puerperium: Secondary | ICD-10-CM

## 2018-11-05 DIAGNOSIS — O3680X0 Pregnancy with inconclusive fetal viability, not applicable or unspecified: Secondary | ICD-10-CM

## 2018-11-05 DIAGNOSIS — Z3A01 Less than 8 weeks gestation of pregnancy: Secondary | ICD-10-CM

## 2018-11-06 LAB — BETA HCG QUANT (REF LAB): hCG Quant: 5550 m[IU]/mL

## 2018-11-06 LAB — PROGESTERONE: Progesterone: 11.7 ng/mL

## 2018-11-07 ENCOUNTER — Other Ambulatory Visit: Payer: Self-pay

## 2018-11-07 ENCOUNTER — Other Ambulatory Visit: Payer: Managed Care, Other (non HMO)

## 2018-11-07 DIAGNOSIS — Z3A01 Less than 8 weeks gestation of pregnancy: Secondary | ICD-10-CM

## 2018-11-07 DIAGNOSIS — O3680X0 Pregnancy with inconclusive fetal viability, not applicable or unspecified: Secondary | ICD-10-CM

## 2018-11-07 DIAGNOSIS — Z8759 Personal history of other complications of pregnancy, childbirth and the puerperium: Secondary | ICD-10-CM

## 2018-11-08 LAB — BETA HCG QUANT (REF LAB): hCG Quant: 7752 m[IU]/mL

## 2018-11-11 ENCOUNTER — Other Ambulatory Visit: Payer: Self-pay | Admitting: Obstetrics and Gynecology

## 2018-11-11 ENCOUNTER — Telehealth: Payer: Self-pay | Admitting: Obstetrics and Gynecology

## 2018-11-11 DIAGNOSIS — IMO0002 Reserved for concepts with insufficient information to code with codable children: Secondary | ICD-10-CM

## 2018-11-11 DIAGNOSIS — Z3689 Encounter for other specified antenatal screening: Secondary | ICD-10-CM

## 2018-11-11 DIAGNOSIS — R799 Abnormal finding of blood chemistry, unspecified: Secondary | ICD-10-CM

## 2018-11-11 DIAGNOSIS — O3680X0 Pregnancy with inconclusive fetal viability, not applicable or unspecified: Secondary | ICD-10-CM

## 2018-11-11 NOTE — Telephone Encounter (Signed)
Patient is schedule 11/12/18 at 8:30 for ultrasound and follow with Dr. Jerene Pitch

## 2018-11-11 NOTE — Telephone Encounter (Signed)
-----   Message from Vena [place], MD sent at 11/11/2018 12:13 PM EDT ----- Regarding: [place] 6/2 or 6/2 Needs dating/viability ultrasound in 1-2 days.   Vena [place], MD, Evern Core [facility], [facility] 11/11/2018, 12:14 PM

## 2018-11-12 ENCOUNTER — Other Ambulatory Visit: Payer: Self-pay

## 2018-11-12 ENCOUNTER — Ambulatory Visit (INDEPENDENT_AMBULATORY_CARE_PROVIDER_SITE_OTHER): Payer: Managed Care, Other (non HMO)

## 2018-11-12 ENCOUNTER — Other Ambulatory Visit: Payer: Self-pay | Admitting: Obstetrics and Gynecology

## 2018-11-12 ENCOUNTER — Encounter: Payer: Self-pay | Admitting: Obstetrics and Gynecology

## 2018-11-12 ENCOUNTER — Ambulatory Visit (INDEPENDENT_AMBULATORY_CARE_PROVIDER_SITE_OTHER): Payer: Managed Care, Other (non HMO) | Admitting: Obstetrics and Gynecology

## 2018-11-12 ENCOUNTER — Other Ambulatory Visit (HOSPITAL_COMMUNITY)
Admission: RE | Admit: 2018-11-12 | Discharge: 2018-11-12 | Disposition: A | Discharge status: Home / Self Care | Payer: Medicaid Other | Source: Ambulatory Visit | Attending: Obstetrics and Gynecology | Admitting: Obstetrics and Gynecology

## 2018-11-12 VITALS — BP 112/64 | Wt 214.0 lb

## 2018-11-12 DIAGNOSIS — Z3689 Encounter for other specified antenatal screening: Secondary | ICD-10-CM

## 2018-11-12 DIAGNOSIS — Z3A01 Less than 8 weeks gestation of pregnancy: Secondary | ICD-10-CM

## 2018-11-12 DIAGNOSIS — R799 Abnormal finding of blood chemistry, unspecified: Secondary | ICD-10-CM

## 2018-11-12 DIAGNOSIS — O3680X0 Pregnancy with inconclusive fetal viability, not applicable or unspecified: Secondary | ICD-10-CM | POA: Diagnosis not present

## 2018-11-12 DIAGNOSIS — IMO0002 Reserved for concepts with insufficient information to code with codable children: Secondary | ICD-10-CM

## 2018-11-12 DIAGNOSIS — Z3480 Encounter for supervision of other normal pregnancy, unspecified trimester: Secondary | ICD-10-CM | POA: Diagnosis not present

## 2018-11-12 DIAGNOSIS — O99210 Obesity complicating pregnancy, unspecified trimester: Secondary | ICD-10-CM

## 2018-11-12 DIAGNOSIS — O99211 Obesity complicating pregnancy, first trimester: Secondary | ICD-10-CM

## 2018-11-12 DIAGNOSIS — Z3401 Encounter for supervision of normal first pregnancy, first trimester: Secondary | ICD-10-CM

## 2018-11-12 LAB — OB RESULTS CONSOLE VARICELLA ZOSTER ANTIBODY, IGG: Varicella: NON-IMMUNE/NOT IMMUNE

## 2018-11-12 LAB — OB RESULTS CONSOLE GC/CHLAMYDIA: Gonorrhea: NEGATIVE

## 2018-11-12 NOTE — Progress Notes (Signed)
ROB/US C/o nausea, bloating and some cramping Denies vb

## 2018-11-12 NOTE — Progress Notes (Signed)
Routine Prenatal Care Visit  Subjective  Karen Woodward is a 18 y.o. G2P0010 at 3570w5d being seen today for ongoing prenatal care.  She is currently monitored for the following issues for this low-risk pregnancy and has Menometrorrhagia; Soft tissue swelling of back; Dysmenorrhea; and Encounter for supervision of normal first pregnancy in first trimester on their problem list.  ----------------------------------------------------------------------------------- Patient reports no complaints.   Contractions: Not present. Vag. Bleeding: None.  Movement: Absent. Denies leaking of fluid.  ----------------------------------------------------------------------------------- The following portions of the patient's history were reviewed and updated as appropriate: allergies, current medications, past family history, past medical history, past social history, past surgical history and problem list. Problem list updated.   Objective  Blood pressure 112/64, weight 214 lb (97.1 kg), last menstrual period 09/26/2018, unknown if currently breastfeeding. Pregravid weight 214 lb (97.1 kg) Total Weight Gain 0 lb (0 kg) Urinalysis:      Fetal Status: Fetal Heart Rate (bpm): 111   Movement: Absent     General:  Alert, oriented and cooperative. Patient is in no acute distress.  Skin: Skin is warm and dry. No rash noted.   Cardiovascular: Normal heart rate noted  Respiratory: Normal respiratory effort, no problems with respiration noted  Abdomen: Soft, gravid, appropriate for gestational age. Pain/Pressure: Absent     Pelvic:  Cervical exam deferred        Extremities: Normal range of motion.     Mental Status: Normal mood and affect. Normal behavior. Normal judgment and thought content.     Assessment   18 y.o. G2P0010 at 4070w5d by  07/03/2019, by Last Menstrual Period presenting for routine prenatal visit  Plan   Pregnancy#2 Problems (from 09/26/18 to present)    Problem Noted Resolved   Obesity in  pregnancy, antepartum 11/12/2018 by Natale MilchSchuman,  R, MD No   Encounter for supervision of normal first pregnancy in first trimester 11/01/2018 by Vena AustriaStaebler, Andreas, MD No   Overview Signed 11/12/2018  9:34 AM by Natale MilchSchuman,  R, MD      Clinic [facility] Prenatal Labs  Dating  LMP =6wk US Blood type: --/--/O NEG (03/15 1840)   Genetic Screen Declines    Antibody:NEG (03/15 1840)  Anatomic US  Rubella:   Varicella: @VZVIGG @  GTT Early:        28 wk:      RPR:     Rhogam  HBsAg:     TDaP vaccine                       HIV:     Flu Shot                                GBS:   Contraception  Pap: Under 21  CBB     CS/VBAC    Baby Food    Support Person                Gestational age appropriate obstetric precautions including but not limited to vaginal bleeding, contractions, leaking of fluid and fetal movement were reviewed in detail with the patient.    US viable IUP today.  Reassurance regarding beta hcg increased by 39%. A 33% or greater increase is sufficient when the initial is > 3,000.  NOB labs today.  1GTT and repeat US for low FHR at next visit.  Consider ASA therapy after 12 wks. Discuss at next visit.   Return  in about 2 weeks (around 11/26/2018) for ROb and [place] and 1 GTT.  Natale Milch MD [facility], [facility] 11/12/2018, 9:34 AM

## 2018-11-13 ENCOUNTER — Other Ambulatory Visit: Payer: Self-pay | Admitting: Obstetrics and Gynecology

## 2018-11-13 DIAGNOSIS — Z3480 Encounter for supervision of other normal pregnancy, unspecified trimester: Secondary | ICD-10-CM

## 2018-11-13 LAB — MONITOR DRUG PROFILE 10(MW)
Amphetamine Scrn, Ur: NEGATIVE ng/mL
BARBITURATE SCREEN URINE: NEGATIVE ng/mL
BENZODIAZEPINE SCREEN, URINE: NEGATIVE ng/mL
CANNABINOIDS UR QL SCN: NEGATIVE ng/mL
Cocaine (Metab) Scrn, Ur: NEGATIVE ng/mL
Creatinine(Crt), U: 203.6 mg/dL (ref 20.0–300.0)
Methadone Screen, Urine: NEGATIVE ng/mL
OXYCODONE+OXYMORPHONE UR QL SCN: NEGATIVE ng/mL
Opiate Scrn, Ur: NEGATIVE ng/mL
Ph of Urine: 5.5 (ref 4.5–8.9)
Phencyclidine Qn, Ur: NEGATIVE ng/mL
Propoxyphene Scrn, Ur: NEGATIVE ng/mL

## 2018-11-13 LAB — RPR+RH+ABO+RUB AB+AB SCR+CB...
Antibody Screen: NEGATIVE
HIV Screen 4th Generation wRfx: NONREACTIVE
Hematocrit: 37.2 % (ref 34.0–46.6)
Hemoglobin: 12.1 g/dL (ref 11.1–15.9)
Hepatitis B Surface Ag: NEGATIVE
MCH: 25 pg — ABNORMAL LOW (ref 26.6–33.0)
MCHC: 32.5 g/dL (ref 31.5–35.7)
MCV: 77 fL — ABNORMAL LOW (ref 79–97)
Platelets: 345 10*3/uL (ref 150–450)
RBC: 4.84 x10E6/uL (ref 3.77–5.28)
RDW: 14.9 % (ref 11.7–15.4)
RPR Ser Ql: NONREACTIVE
Rubella Antibodies, IGG: 6.05 index (ref >0.99)
Varicella zoster IgG: <135 index — ABNORMAL LOW (ref >165)
WBC: 8.5 10*3/uL (ref 3.4–10.8)

## 2018-11-13 NOTE — Progress Notes (Signed)
Released to mychart, varicella nonimmune and weak D.

## 2018-11-14 LAB — URINE CULTURE: Organism ID, Bacteria: NO GROWTH

## 2018-11-15 LAB — URINE CYTOLOGY ANCILLARY ONLY
Chlamydia: NEGATIVE
Neisseria Gonorrhea: NEGATIVE

## 2018-11-15 NOTE — Progress Notes (Signed)
WNL, released to mychart.

## 2018-11-21 ENCOUNTER — Ambulatory Visit (INDEPENDENT_AMBULATORY_CARE_PROVIDER_SITE_OTHER): Payer: Managed Care, Other (non HMO) | Admitting: Obstetrics and Gynecology

## 2018-11-21 ENCOUNTER — Other Ambulatory Visit: Payer: Self-pay

## 2018-11-21 VITALS — BP 121/75 | Wt 217.0 lb

## 2018-11-21 DIAGNOSIS — Z3401 Encounter for supervision of normal first pregnancy, first trimester: Secondary | ICD-10-CM

## 2018-11-21 DIAGNOSIS — O418X10 Other specified disorders of amniotic fluid and membranes, first trimester, not applicable or unspecified: Secondary | ICD-10-CM

## 2018-11-21 DIAGNOSIS — O418X90 Other specified disorders of amniotic fluid and membranes, unspecified trimester, not applicable or unspecified: Secondary | ICD-10-CM | POA: Insufficient documentation

## 2018-11-21 DIAGNOSIS — Z3A08 8 weeks gestation of pregnancy: Secondary | ICD-10-CM

## 2018-11-21 NOTE — Progress Notes (Signed)
Routine Prenatal Care Visit  Subjective  Karen Woodward is a 18 y.o. G2P0010 at [redacted]w[redacted]d being seen today for ongoing prenatal care.  She is currently monitored for the following issues for this low-risk pregnancy and has Menometrorrhagia; Soft tissue swelling of back; Dysmenorrhea; Encounter for supervision of normal first pregnancy in first trimester; Obesity in pregnancy, antepartum; and Abnormal yolk sac on their problem list.  ----------------------------------------------------------------------------------- Patient reports no complaints.   Contractions: Not present. Vag. Bleeding: None.  Movement: Absent. Denies leaking of fluid.  ----------------------------------------------------------------------------------- The following portions of the patient's history were reviewed and updated as appropriate: allergies, current medications, past family history, past medical history, past social history, past surgical history and problem list. Problem list updated.   Objective  Blood pressure 121/75, weight 217 lb (98.4 kg), last menstrual period 09/26/2018, unknown if currently breastfeeding. Pregravid weight 214 lb (97.1 kg) Total Weight Gain 3 lb (1.361 kg) Urinalysis:      Fetal Status: Fetal Heart Rate (bpm): 162   Movement: Absent     General:  Alert, oriented and cooperative. Patient is in no acute distress.  Skin: Skin is warm and dry. No rash noted.   Cardiovascular: Normal heart rate noted  Respiratory: Normal respiratory effort, no problems with respiration noted  Abdomen: Soft, gravid, appropriate for gestational age. Pain/Pressure: Absent     Pelvic:  Cervical exam deferred        Extremities: Normal range of motion.     ental Status: Normal mood and affect. Normal behavior. Normal judgment and thought content.   Bedside US singleton IUP CRL [redacted]w[redacted]d, FHT 168, YS is prominent and measures 6.86mm  Assessment   18 y.o. G2P0010 at [redacted]w[redacted]d by  07/03/2019, by Last Menstrual Period  presenting for routine prenatal visit  Plan   Pregnancy#2 Problems (from 09/26/18 to present)    Problem Noted Resolved   Obesity in pregnancy, antepartum 11/12/2018 by Homero Fellers, MD No   Encounter for supervision of normal first pregnancy in first trimester 11/01/2018 by Malachy Mood, MD No   Overview Signed 11/12/2018  9:34 AM by Homero Fellers, MD      Clinic [facility] Prenatal Labs  Dating  LMP =6wk [place] Blood type: --/--/O NEG (03/15 1840)   Genetic Screen Declines    Antibody:NEG (03/15 1840)  Anatomic [place]  Rubella:   Varicella: @VZVIGG @  GTT Early:        28 wk:      RPR:     Rhogam  HBsAg:     TDaP vaccine                       HIV:     Flu Shot                                GBS:   Contraception  Pap: Under 21  CBB     CS/VBAC    Baby Food    Support Person                 Gestational age appropriate obstetric precautions including but not limited to vaginal bleeding, contractions, leaking of fluid and fetal movement were reviewed in detail with the patient.    - Given lower level HCG rise, progesterone of 11.7, and slightly enlarged yolk sac discussed continued closer follow up  Return in about 1 week (around 11/28/2018) for ROB (should already be scheduled).  Vena AustriaAndreas Umeka Wrench, MD, Evern CoreFACOG [facility], [facility] 11/21/2018, 4:03 PM

## 2018-11-21 NOTE — Progress Notes (Signed)
Follow up.

## 2018-11-26 ENCOUNTER — Other Ambulatory Visit: Payer: Managed Care, Other (non HMO)

## 2018-11-26 ENCOUNTER — Ambulatory Visit (INDEPENDENT_AMBULATORY_CARE_PROVIDER_SITE_OTHER): Payer: Managed Care, Other (non HMO)

## 2018-11-26 ENCOUNTER — Other Ambulatory Visit: Payer: Self-pay

## 2018-11-26 ENCOUNTER — Ambulatory Visit (INDEPENDENT_AMBULATORY_CARE_PROVIDER_SITE_OTHER): Payer: Managed Care, Other (non HMO) | Admitting: Obstetrics and Gynecology

## 2018-11-26 ENCOUNTER — Other Ambulatory Visit: Payer: Self-pay | Admitting: Obstetrics and Gynecology

## 2018-11-26 VITALS — BP 122/66 | Wt 219.0 lb

## 2018-11-26 DIAGNOSIS — Z3A08 8 weeks gestation of pregnancy: Secondary | ICD-10-CM

## 2018-11-26 DIAGNOSIS — O3680X0 Pregnancy with inconclusive fetal viability, not applicable or unspecified: Secondary | ICD-10-CM | POA: Diagnosis not present

## 2018-11-26 DIAGNOSIS — Z3480 Encounter for supervision of other normal pregnancy, unspecified trimester: Secondary | ICD-10-CM

## 2018-11-26 DIAGNOSIS — Z3401 Encounter for supervision of normal first pregnancy, first trimester: Secondary | ICD-10-CM

## 2018-11-26 LAB — POCT URINALYSIS DIPSTICK OB
Glucose, UA: NEGATIVE
POC,PROTEIN,UA: NEGATIVE

## 2018-11-26 NOTE — Progress Notes (Signed)
Routine Prenatal Care Visit  Subjective  Karen Woodward is a 18 y.o. G2P0010 at [redacted]w[redacted]d being seen today for ongoing prenatal care.  She is currently monitored for the following issues for this low-risk pregnancy and has Menometrorrhagia; Soft tissue swelling of back; Dysmenorrhea; Encounter for supervision of normal first pregnancy in first trimester; Obesity in pregnancy, antepartum; and Abnormal yolk sac on their problem list.  ----------------------------------------------------------------------------------- Patient reports no complaints.   Contractions: Not present. Vag. Bleeding: None.  Movement: Absent. Denies leaking of fluid.  ----------------------------------------------------------------------------------- The following portions of the patient's history were reviewed and updated as appropriate: allergies, current medications, past family history, past medical history, past social history, past surgical history and problem list. Problem list updated.   Objective  Blood pressure 122/66, weight 219 lb (99.3 kg), last menstrual period 09/26/2018, unknown if currently breastfeeding. Pregravid weight 214 lb (97.1 kg) Total Weight Gain 5 lb (2.268 kg) Urinalysis:      Fetal Status: Fetal Heart Rate (bpm): 169   Movement: Absent     General:  Alert, oriented and cooperative. Patient is in no acute distress.  Skin: Skin is warm and dry. No rash noted.   Cardiovascular: Normal heart rate noted  Respiratory: Normal respiratory effort, no problems with respiration noted  Abdomen: Soft, gravid, appropriate for gestational age. Pain/Pressure: Absent     Pelvic:  Cervical exam deferred        Extremities: Normal range of motion.     ental Status: Normal mood and affect. Normal behavior. Normal judgment and thought content.   US Ob Comp Less 14 Wks  Result Date: 11/26/2018 Patient Name: Karen Woodward DOB: 2001-04-20 MRN: [id number] ULTRASOUND REPORT Location: [facility] Date of  Service: 11/26/2018 Indications: Viability Findings: Karen Woodward intrauterine pregnancy is visualized with a CRL consistent with [redacted]w[redacted]d gestation, giving an (U/S) EDD of 07/02/19. The (U/S) EDD is consistent with the clinically established EDD of 07/03/19. FHR: 169 BPM CRL measurement: 21.9 mm Yolk sac is visualized and appears normal and early anatomy is normal. Amnion: visualized and appears normal Right Ovary is normal in appearance. Left Ovary is normal appearance. Survey of the adnexa demonstrates no adnexal masses. There is no free peritoneal fluid in the cul de sac. Impression: 1. [redacted]w[redacted]d Viable Singleton Intrauterine pregnancy by U/S. 2. (U/S) EDD is consistent with Clinically established EDD of 07/03/19. Recommendations: 1.Clinical correlation with the patient's History and Physical Exam. Vita Barley, RT There is a viable singleton gestation.  The fetal biometry correlates with established dating. Detailed evaluation of the fetal anatomy is precluded by early gestational age. Yolk sac 5.85mm and normal in appearance.   It must be noted that a normal ultrasound particular at this early gestational age is unable to rule out fetal aneuploidy, risk of first trimester miscarriage, or anatomic birth defects. Malachy Mood, MD, [facility], [facility] 11/26/2018, 11:17 AM   US Ob Transvaginal  Result Date: 11/12/2018 Patient Name: Karen Woodward DOB: 03/01/2001 MRN: [id number] ULTRASOUND REPORT Location: [facility] Date of Service: 11/12/2018 Indications: Abnormal HCG rise Findings: Karen Woodward intrauterine pregnancy is visualized with a CRL consistent with [redacted]w[redacted]d gestation, giving an (U/S) EDD of 07/04/19. The (U/S) EDD is consistent with the clinically established EDD of 07/03/19. FHR: 111 BPM CRL measurement: 6.7 mm Yolk sac appears slightly large measuring 4.16mm. Amnion: not visualized Right Ovary is normal in appearance. Left Ovary is normal appearance. Corpus luteal cyst:  Right ovary  Survey of the adnexa demonstrates no adnexal masses.  There is no free peritoneal fluid in the cul de sac. Impression: 1. 5237w4d Viable Singleton Intrauterine pregnancy by U/S. 2. (U/S) EDD is consistent with Clinically established EDD of 07/03/19. Recommendations: 1.Clinical correlation with the patient's History and Physical Exam. Darlina GuysAbby M Clarke, RT I have reviewed this ultrasound and the report. I agree with the above assessment and plan. Adelene Idlerhristanna Schuman MD [facility] 11/12/18 11:09 AM      Assessment   17 y.o. G2P0010 at 3834w5d by  07/03/2019, by Last Menstrual Period presenting for routine prenatal visit  Plan   Pregnancy#2 Problems (from 09/26/18 to present)    Problem Noted Resolved   Obesity in pregnancy, antepartum 11/12/2018 by Natale MilchSchuman, Christanna R, MD No   Encounter for supervision of normal first pregnancy in first trimester 11/01/2018 by Vena AustriaStaebler, Aisling Emigh, MD No   Overview Signed 11/12/2018  9:34 AM by Natale MilchSchuman, Christanna R, MD      [facility]inic [facility] Prenatal Labs  Dating  LMP =6wk US Blood type: --/--/O NEG (03/15 1840)   Genetic Screen Declines    Antibody:NEG (03/15 1840)  Anatomic US  Rubella: Immune Varicella: Non-Immune  GTT Early:        28 wk:      RPR:  NR  Rhogam  HBsAg:  Neg  TDaP vaccine                       HIV: Neg    Flu Shot                                GBS:   Contraception  Pap: Under 21  CBB     CS/VBAC    Baby Food    Support Person                 Gestational age appropriate obstetric precautions including but not limited to vaginal bleeding, contractions, leaking of fluid and fetal movement were reviewed in detail with the patient.    - Strongly encouraged genetic testing - Viable IUP with NORMAL yolk sac measurement today  Return in about 1 week (around 12/03/2018) for ROB Willine Schwalbe.  Vena AustriaAndreas Shun Pletz, MD, Evern CoreFACOG [facility], [facility] 11/26/2018, 11:21 AM

## 2018-11-26 NOTE — Progress Notes (Signed)
ROB GTT had to be rescheduled

## 2018-12-02 ENCOUNTER — Other Ambulatory Visit: Payer: Managed Care, Other (non HMO)

## 2018-12-02 ENCOUNTER — Other Ambulatory Visit: Payer: Self-pay

## 2018-12-02 ENCOUNTER — Ambulatory Visit (INDEPENDENT_AMBULATORY_CARE_PROVIDER_SITE_OTHER): Payer: Managed Care, Other (non HMO) | Admitting: Obstetrics and Gynecology

## 2018-12-02 VITALS — BP 124/80 | Wt 215.0 lb

## 2018-12-02 DIAGNOSIS — Z3A09 9 weeks gestation of pregnancy: Secondary | ICD-10-CM

## 2018-12-02 DIAGNOSIS — O99211 Obesity complicating pregnancy, first trimester: Secondary | ICD-10-CM

## 2018-12-02 LAB — POCT URINALYSIS DIPSTICK OB: Glucose, UA: NEGATIVE

## 2018-12-02 NOTE — Progress Notes (Signed)
    Routine Prenatal Care Visit  Subjective  Karen Woodward is a 18 y.o. G2P0010 at [redacted]w[redacted]d being seen today for ongoing prenatal care.  She is currently monitored for the following issues for this low-risk pregnancy and has Menometrorrhagia; Soft tissue swelling of back; Dysmenorrhea; Encounter for supervision of normal first pregnancy in first trimester; Obesity in pregnancy, antepartum; and Abnormal yolk sac on their problem list.  ----------------------------------------------------------------------------------- Patient reports no complaints.  Nausea resolved in the past week Contractions: Not present. Vag. Bleeding: None.  Movement: Absent. Denies leaking of fluid.  ----------------------------------------------------------------------------------- The following portions of the patient's history were reviewed and updated as appropriate: allergies, current medications, past family history, past medical history, past social history, past surgical history and problem list. Problem list updated.   Objective  Blood pressure 124/80, weight 215 lb (97.5 kg), last menstrual period 09/26/2018, unknown if currently breastfeeding. Pregravid weight 214 lb (97.1 kg) Total Weight Gain 1 lb (0.454 kg) Urinalysis:      Fetal Status: Fetal Heart Rate (bpm): 166   Movement: Absent     General:  Alert, oriented and cooperative. Patient is in no acute distress.  Skin: Skin is warm and dry. No rash noted.   Cardiovascular: Normal heart rate noted  Respiratory: Normal respiratory effort, no problems with respiration noted  Abdomen: Soft, gravid, appropriate for gestational age. Pain/Pressure: Absent     Pelvic:  Cervical exam deferred        Extremities: Normal range of motion.     ental Status: Normal mood and affect. Normal behavior. Normal judgment and thought content.     Assessment   18 y.o. G2P0010 at [redacted]w[redacted]d by  07/03/2019, by Last Menstrual Period presenting for work-in prenatal visit  Plan    Pregnancy#2 Problems (from 09/26/18 to present)    Problem Noted Resolved   Obesity in pregnancy, antepartum 11/12/2018 by Homero Fellers, MD No   Encounter for supervision of normal first pregnancy in first trimester 11/01/2018 by Malachy Mood, MD No   Overview Addendum 11/26/2018 11:20 AM by Malachy Mood, MD      Clinic [facility] Prenatal Labs  Dating  LMP =6wk [place] Blood type: --/--/O NEG (03/15 1840)   Genetic Screen Declines    Antibody:NEG (03/15 1840)  Anatomic [place]  Rubella: Immune Varicella: Non-Immune  GTT Early:        28 wk:      RPR: NR  Rhogam  HBsAg: negative  TDaP vaccine                       HIV: negative  Flu Shot                                GBS:   Contraception  Pap: Under 21  CBB     CS/VBAC    Baby Food    Support Person                 Gestational age appropriate obstetric precautions including but not limited to vaginal bleeding, contractions, leaking of fluid and fetal movement were reviewed in detail with the patient.    Return in about 1 week (around 12/09/2018) for [place].  Malachy Mood, MD, [facility], [facility] 12/02/2018, 12:20 PM

## 2018-12-02 NOTE — Progress Notes (Signed)
ROB No concerns 

## 2018-12-10 ENCOUNTER — Ambulatory Visit (INDEPENDENT_AMBULATORY_CARE_PROVIDER_SITE_OTHER): Payer: Managed Care, Other (non HMO) | Admitting: Obstetrics and Gynecology

## 2018-12-10 ENCOUNTER — Other Ambulatory Visit: Payer: Self-pay

## 2018-12-10 VITALS — BP 122/60 | Wt 216.0 lb

## 2018-12-10 DIAGNOSIS — Z1379 Encounter for other screening for genetic and chromosomal anomalies: Secondary | ICD-10-CM

## 2018-12-10 DIAGNOSIS — Z31438 Encounter for other genetic testing of female for procreative management: Secondary | ICD-10-CM

## 2018-12-10 DIAGNOSIS — Z3401 Encounter for supervision of normal first pregnancy, first trimester: Secondary | ICD-10-CM

## 2018-12-10 DIAGNOSIS — O418X10 Other specified disorders of amniotic fluid and membranes, first trimester, not applicable or unspecified: Secondary | ICD-10-CM

## 2018-12-10 DIAGNOSIS — O99211 Obesity complicating pregnancy, first trimester: Secondary | ICD-10-CM

## 2018-12-10 DIAGNOSIS — O99210 Obesity complicating pregnancy, unspecified trimester: Secondary | ICD-10-CM

## 2018-12-10 DIAGNOSIS — Z3A10 10 weeks gestation of pregnancy: Secondary | ICD-10-CM

## 2018-12-10 NOTE — Progress Notes (Signed)
ROB

## 2018-12-10 NOTE — Progress Notes (Signed)
    Routine Prenatal Care Visit  Subjective  Karen Woodward is a 17 y.o. G2P0010 at [redacted]w[redacted]d being seen today for ongoing prenatal care.  She is currently monitored for the following issues for this low-risk pregnancy and has Menometrorrhagia; Soft tissue swelling of back; Dysmenorrhea; Encounter for supervision of normal first pregnancy in first trimester; Obesity in pregnancy, antepartum; and Abnormal yolk sac on their problem list.  ----------------------------------------------------------------------------------- Patient reports no complaints.   Contractions: Not present. Vag. Bleeding: None.  Movement: Absent. Denies leaking of fluid.  ----------------------------------------------------------------------------------- The following portions of the patient's history were reviewed and updated as appropriate: allergies, current medications, past family history, past medical history, past social history, past surgical history and problem list. Problem list updated.   Objective  Blood pressure 122/60, weight 216 lb (98 kg), last menstrual period 09/26/2018, unknown if currently breastfeeding. Pregravid weight 214 lb (97.1 kg) Total Weight Gain 2 lb (0.907 kg) Urinalysis:      Fetal Status: Fetal Heart Rate (bpm): 159   Movement: Absent     General:  Alert, oriented and cooperative. Patient is in no acute distress.  Skin: Skin is warm and dry. No rash noted.   Cardiovascular: Normal heart rate noted  Respiratory: Normal respiratory effort, no problems with respiration noted  Abdomen: Soft, gravid, appropriate for gestational age. Pain/Pressure: Absent     Pelvic:  Cervical exam deferred        Extremities: Normal range of motion.     ental Status: Normal mood and affect. Normal behavior. Normal judgment and thought content.     Assessment   18 y.o. G2P0010 at [redacted]w[redacted]d by  07/03/2019, by Last Menstrual Period presenting for routine prenatal visit  Plan   Pregnancy#2 Problems (from  09/26/18 to present)    Problem Noted Resolved   Obesity in pregnancy, antepartum 11/12/2018 by Homero Fellers, MD No   Encounter for supervision of normal first pregnancy in first trimester 11/01/2018 by Malachy Mood, MD No   Overview Addendum 11/26/2018 11:20 AM by Malachy Mood, MD      [facility]inic [facility] Prenatal Labs  Dating  LMP =6wk [place] Blood type: --/--/O NEG (03/15 1840)   Genetic Screen Declines    Antibody:NEG (03/15 1840)  Anatomic [place]  Rubella: Immune Varicella: Non-Immune  GTT Early:        28 wk:      RPR: NR  Rhogam  HBsAg: negative  TDaP vaccine                       HIV: negative  Flu Shot                                GBS:   Contraception  Pap: Under 21  CBB     CS/VBAC    Baby Food    Support Person                 Gestational age appropriate obstetric precautions including but not limited to vaginal bleeding, contractions, leaking of fluid and fetal movement were reviewed in detail with the patient.    - Continue TLC care - Genetic testing today - Rh genotyping still pending  Return in about 1 week (around 12/17/2018) for ROB [name].  Malachy Mood, MD, [facility], [facility] 12/10/2018, 2:18 PM

## 2018-12-14 LAB — MATERNIT 21 PLUS CORE, BLOOD
Fetal Fraction: 5
Result (T21): NEGATIVE
Trisomy 13 (Patau syndrome): NEGATIVE
Trisomy 18 (Edwards syndrome): NEGATIVE
Trisomy 21 (Down syndrome): NEGATIVE

## 2018-12-18 ENCOUNTER — Ambulatory Visit (INDEPENDENT_AMBULATORY_CARE_PROVIDER_SITE_OTHER): Payer: Managed Care, Other (non HMO) | Admitting: Obstetrics and Gynecology

## 2018-12-18 ENCOUNTER — Other Ambulatory Visit: Payer: Self-pay

## 2018-12-18 ENCOUNTER — Encounter: Payer: Self-pay | Admitting: Obstetrics and Gynecology

## 2018-12-18 VITALS — BP 138/67 | Wt 217.0 lb

## 2018-12-18 DIAGNOSIS — O99210 Obesity complicating pregnancy, unspecified trimester: Secondary | ICD-10-CM

## 2018-12-18 DIAGNOSIS — Z3401 Encounter for supervision of normal first pregnancy, first trimester: Secondary | ICD-10-CM

## 2018-12-18 DIAGNOSIS — O99211 Obesity complicating pregnancy, first trimester: Secondary | ICD-10-CM

## 2018-12-18 DIAGNOSIS — Z3A11 11 weeks gestation of pregnancy: Secondary | ICD-10-CM

## 2018-12-18 DIAGNOSIS — Z6837 Body mass index (BMI) 37.0-37.9, adult: Secondary | ICD-10-CM | POA: Insufficient documentation

## 2018-12-18 LAB — POCT URINALYSIS DIPSTICK OB
Glucose, UA: NEGATIVE
POC,PROTEIN,UA: NEGATIVE

## 2018-12-18 NOTE — Progress Notes (Signed)
Routine Prenatal Care Visit  Subjective  Karen Woodward is a 18 y.o. G2P0010 at 2993w6d being seen today for ongoing prenatal care.  She is currently monitored for the following issues for this low-risk pregnancy and has Menometrorrhagia; Soft tissue swelling of back; Dysmenorrhea; Encounter for supervision of normal first pregnancy in first trimester; Obesity in pregnancy, antepartum; Abnormal yolk sac; and BMI 37.0-37.9, adult on their problem list.  ----------------------------------------------------------------------------------- Patient reports daily, short duration palpitations. She occasionally has shortness of breath.  Denies feeling dizzy, having chest pain.  Has no cardiac history.  She states this preceded the pregnancy. Contractions: Not present. Vag. Bleeding: None.  Movement: Absent. Denies leaking of fluid.  ----------------------------------------------------------------------------------- The following portions of the patient's history were reviewed and updated as appropriate: allergies, current medications, past family history, past medical history, past social history, past surgical history and problem list. Problem list updated.  Objective  Blood pressure 138/67, weight 217 lb (98.4 kg), last menstrual period 09/26/2018, unknown if currently breastfeeding. Pregravid weight 214 lb (97.1 kg) Total Weight Gain 3 lb (1.361 kg) Urinalysis: Urine Protein Negative  Urine Glucose Negative  Fetal Status: Fetal Heart Rate (bpm): 150   Movement: Absent     General:  Alert, oriented and cooperative. Patient is in no acute distress.  Skin: Skin is warm and dry. No rash noted.   Cardiovascular: Normal heart rate noted  Respiratory: Normal respiratory effort, no problems with respiration noted  Abdomen: Soft, gravid, appropriate for gestational age. Pain/Pressure: Absent     Pelvic:  Cervical exam deferred        Extremities: Normal range of motion.  Edema: None  Mental Status: Normal  mood and affect. Normal behavior. Normal judgment and thought content.   Assessment   18 y.o. G2P0010 at 1493w6d by  07/03/2019, by Last Menstrual Period presenting for routine prenatal visit  Plan   Pregnancy#2 Problems (from 09/26/18 to present)    Problem Noted Resolved   BMI 37.0-37.9, adult 12/18/2018 by Conard NovakJackson, Sherylann Vangorden D, MD No   Obesity in pregnancy, antepartum 11/12/2018 by Natale MilchSchuman, Christanna R, MD No   Encounter for supervision of normal first pregnancy in first trimester 11/01/2018 by Vena AustriaStaebler, Andreas, MD No   Overview Addendum 12/16/2018  8:46 AM by Vena AustriaStaebler, Andreas, MD      [facility]inic [facility] Prenatal Labs  Dating  LMP =6wk US Blood type: --/--/O NEG (03/15 1840)   Genetic Screen Normal XY Antibody:NEG (03/15 1840)  Anatomic US  Rubella: Immune Varicella: Non-Immune  GTT Early:        28 wk:      RPR: NR  Rhogam  HBsAg: negative  TDaP vaccine                       HIV: negative  Flu Shot                                GBS:   Contraception  Pap: Under 21  CBB     CS/VBAC    Baby Food    Support Person              Preterm labor symptoms and general obstetric precautions including but not limited to vaginal bleeding, contractions, leaking of fluid and fetal movement were reviewed in detail with the patient. Please refer to After Visit Summary for other counseling recommendations.   - Continue TLC care - Rh genotyping still pending -  if palpitations continue, consider EKG with electrolytes (CBC normal at baseline on 6/2).  If EKG and labs normal, consider cardiology consult.  She denies recent GERD symptoms and heartburn.   Return in about 1 week (around 12/25/2018) for Routine Prenatal Appointment.  Prentice Docker, MD, [facility], [facility] 12/18/2018 4:44 PM

## 2018-12-26 LAB — INHERITEST CORE(CF97,SMA,FRAX)

## 2019-01-02 ENCOUNTER — Other Ambulatory Visit: Payer: Self-pay

## 2019-01-02 ENCOUNTER — Encounter: Payer: Self-pay | Admitting: Obstetrics and Gynecology

## 2019-01-02 ENCOUNTER — Ambulatory Visit (INDEPENDENT_AMBULATORY_CARE_PROVIDER_SITE_OTHER): Payer: Managed Care, Other (non HMO) | Admitting: Obstetrics and Gynecology

## 2019-01-02 VITALS — BP 113/69 | Wt 216.0 lb

## 2019-01-02 DIAGNOSIS — Z6837 Body mass index (BMI) 37.0-37.9, adult: Secondary | ICD-10-CM

## 2019-01-02 DIAGNOSIS — Z3A14 14 weeks gestation of pregnancy: Secondary | ICD-10-CM

## 2019-01-02 DIAGNOSIS — O9989 Other specified diseases and conditions complicating pregnancy, childbirth and the puerperium: Secondary | ICD-10-CM

## 2019-01-02 DIAGNOSIS — O99212 Obesity complicating pregnancy, second trimester: Secondary | ICD-10-CM

## 2019-01-02 DIAGNOSIS — M549 Dorsalgia, unspecified: Secondary | ICD-10-CM

## 2019-01-02 DIAGNOSIS — O99891 Other specified diseases and conditions complicating pregnancy: Secondary | ICD-10-CM

## 2019-01-02 DIAGNOSIS — O99210 Obesity complicating pregnancy, unspecified trimester: Secondary | ICD-10-CM

## 2019-01-02 DIAGNOSIS — Z3401 Encounter for supervision of normal first pregnancy, first trimester: Secondary | ICD-10-CM

## 2019-01-02 LAB — POCT URINALYSIS DIPSTICK OB
Glucose, UA: NEGATIVE
POC,PROTEIN,UA: NEGATIVE

## 2019-01-02 NOTE — Progress Notes (Signed)
Routine Prenatal Care Visit  Subjective  Karen Woodward is a 18 y.o. G2P0010 at [redacted]w[redacted]d being seen today for ongoing prenatal care.  She is currently monitored for the following issues for this high-risk pregnancy and has Menometrorrhagia; Soft tissue swelling of back; Dysmenorrhea; Encounter for supervision of normal first pregnancy in first trimester; Obesity in pregnancy, antepartum; Abnormal yolk sac; and BMI 37.0-37.9, adult on their problem list.  ----------------------------------------------------------------------------------- Patient reports right hip pain only at night when she has to void.  It usually feels better about 30 minutes after voiding.  Doesn't happen every day.  No fever or chills. No urinary symptoms.  Does have a history of pyelonephritis.    Contractions: Not present. Vag. Bleeding: None.  Movement: Absent. Denies leaking of fluid.  ----------------------------------------------------------------------------------- The following portions of the patient's history were reviewed and updated as appropriate: allergies, current medications, past family history, past medical history, past social history, past surgical history and problem list. Problem list updated.   Objective  Blood pressure 113/69, weight 216 lb (98 kg), last menstrual period 09/26/2018, unknown if currently breastfeeding. Pregravid weight 214 lb (97.1 kg) Total Weight Gain 2 lb (0.907 kg) Urinalysis: Urine Protein Negative  Urine Glucose Negative  Fetal Status: Fetal Heart Rate (bpm): 145   Movement: Absent     General:  Alert, oriented and cooperative. Patient is in no acute distress.  Skin: Skin is warm and dry. No rash noted.   Cardiovascular: Normal heart rate noted  Respiratory: Normal respiratory effort, no problems with respiration noted  Abdomen: Soft, gravid, appropriate for gestational age. Pain/Pressure: Absent   , no CVAT  Pelvic:  Cervical exam deferred        Extremities: Normal range of  motion.  Edema: None  Mental Status: Normal mood and affect. Normal behavior. Normal judgment and thought content.   Assessment   18 y.o. G2P0010 at [redacted]w[redacted]d by  07/03/2019, by Last Menstrual Period presenting for routine prenatal visit  Plan   Pregnancy#2 Problems (from 09/26/18 to present)    Problem Noted Resolved   BMI 37.0-37.9, adult 12/18/2018 by Will Bonnet, MD No   Obesity in pregnancy, antepartum 11/12/2018 by Homero Fellers, MD No   Encounter for supervision of normal first pregnancy in first trimester 11/01/2018 by Malachy Mood, MD No   Overview Addendum 12/16/2018  8:46 AM by Malachy Mood, MD      [facility]inic [facility] Prenatal Labs  Dating  LMP =6wk [place] Blood type: --/--/O NEG (03/15 1840)   Genetic Screen Normal XY Antibody:NEG (03/15 1840)  Anatomic [place]  Rubella: Immune Varicella: Non-Immune  GTT Early:        28 wk:      RPR: NR  Rhogam  HBsAg: negative  TDaP vaccine                       HIV: negative  Flu Shot                                GBS:   Contraception  Pap: Under 21  CBB     CS/VBAC    Baby Food    Support Person                 Preterm labor symptoms and general obstetric precautions including but not limited to vaginal bleeding, contractions, leaking of fluid and fetal movement were reviewed in detail with the patient. Please  refer to After Visit Summary for other counseling recommendations.   - pain is in lower back on right just above iliac crest.  Does not appear to be pyelonephritis.  Wills end urine for culture.   Return in about 1 week (around 01/09/2019) for needs 1h gtt appt asap and 4 week u/s for anatomy with ROB appt.  Thomasene MohairStephen Edwinna Rochette, MD, Merlinda FrederickFACOG [facility], [facility] 01/02/2019 5:18 PM

## 2019-01-09 ENCOUNTER — Other Ambulatory Visit: Payer: Managed Care, Other (non HMO)

## 2019-01-09 ENCOUNTER — Other Ambulatory Visit: Payer: Self-pay | Admitting: Obstetrics and Gynecology

## 2019-01-09 ENCOUNTER — Other Ambulatory Visit: Payer: Self-pay

## 2019-01-10 LAB — RH-HR GENOTYPE W/ABO GROUPING
C -: POSITIVE
E -: NEGATIVE
Rh(D)-: POSITIVE
c -: POSITIVE
e -: POSITIVE

## 2019-01-10 LAB — GLUCOSE TOLERANCE, 1 HOUR: Glucose, 1Hr PP: 98 mg/dL (ref 65–199)

## 2019-01-14 NOTE — Progress Notes (Signed)
I believe you saw this patient most recently

## 2019-01-30 ENCOUNTER — Ambulatory Visit (INDEPENDENT_AMBULATORY_CARE_PROVIDER_SITE_OTHER): Payer: Managed Care, Other (non HMO)

## 2019-01-30 ENCOUNTER — Ambulatory Visit (INDEPENDENT_AMBULATORY_CARE_PROVIDER_SITE_OTHER): Payer: Managed Care, Other (non HMO) | Admitting: Obstetrics and Gynecology

## 2019-01-30 ENCOUNTER — Other Ambulatory Visit: Payer: Self-pay

## 2019-01-30 VITALS — BP 108/62 | Wt 215.0 lb

## 2019-01-30 DIAGNOSIS — Z3401 Encounter for supervision of normal first pregnancy, first trimester: Secondary | ICD-10-CM

## 2019-01-30 DIAGNOSIS — Z363 Encounter for antenatal screening for malformations: Secondary | ICD-10-CM

## 2019-01-30 DIAGNOSIS — Z3A18 18 weeks gestation of pregnancy: Secondary | ICD-10-CM

## 2019-01-30 DIAGNOSIS — O99212 Obesity complicating pregnancy, second trimester: Secondary | ICD-10-CM

## 2019-01-30 LAB — POCT URINALYSIS DIPSTICK OB
Glucose, UA: NEGATIVE
POC,PROTEIN,UA: NEGATIVE

## 2019-01-30 NOTE — Progress Notes (Signed)
Routine Prenatal Care Visit  Subjective  Karen Woodward is a 18 y.o. G2P0010 at [redacted]w[redacted]d being seen today for ongoing prenatal care.  She is currently monitored for the following issues for this low-risk pregnancy and has Menometrorrhagia; Soft tissue swelling of back; Dysmenorrhea; Encounter for supervision of normal first pregnancy in first trimester; Obesity in pregnancy, antepartum; Abnormal yolk sac; and BMI 37.0-37.9, adult on their problem list.  ----------------------------------------------------------------------------------- Patient reports no complaints.    .  .   . Denies leaking of fluid.  ----------------------------------------------------------------------------------- The following portions of the patient's history were reviewed and updated as appropriate: allergies, current medications, past family history, past medical history, past social history, past surgical history and problem list. Problem list updated.   Objective  Blood pressure 108/62, weight 215 lb (97.5 kg), last menstrual period 09/26/2018, unknown if currently breastfeeding. Pregravid weight 214 lb (97.1 kg) Total Weight Gain 1 lb (0.454 kg) Urinalysis:      Fetal Status:           General:  Alert, oriented and cooperative. Patient is in no acute distress.  Skin: Skin is warm and dry. No rash noted.   Cardiovascular: Normal heart rate noted  Respiratory: Normal respiratory effort, no problems with respiration noted  Abdomen: Soft, gravid, appropriate for gestational age.       Pelvic:  Cervical exam deferred        Extremities: Normal range of motion.     ental Status: Normal Woodward and affect. Normal behavior. Normal judgment and thought content.   US Ob Comp + 14 Wk  Result Date: 01/30/2019 Patient Name: Karen Woodward DOB: Sep 28, 2000 MRN: [id number] ULTRASOUND REPORT Location: [facility] Date of Service: 01/30/2019 Indications:Anatomy Ultrasound Findings: Karen Woodward intrauterine pregnancy is  visualized with FHR at 130 BPM. Biometrics give an (U/S) Gestational age of 109w2d and an (U/S) EDD of 07/01/2019; this correlates with the clinically established Estimated Date of Delivery: 07/03/19 Fetal presentation is Cephalic. EFW: 242 g ( 9 oz ). Placenta: anterior. Grade: 0 AFI: subjectively normal. Anatomic survey is complete and normal; Gender - female.  Impression: 1. [redacted]w[redacted]d Viable Singleton Intrauterine pregnancy by U/S. 2. (U/S) EDD is consistent with Clinically established Estimated Date of Delivery: 07/03/19 . 3. Normal Anatomy Scan Recommendations: 1.Clinical correlation with the patient's History and Physical Exam. Karen Woodward, RT  There is a singleton gestation with subjectively normal amniotic fluid volume. The fetal biometry correlates with established dating. Detailed evaluation of the fetal anatomy was performed.The fetal anatomical survey appears within normal limits within the resolution of ultrasound as described above.  It must be noted that a normal ultrasound is unable to rule out fetal aneuploidy, subtle defects such as small ASD or VDS may also not be visible on imaging.  Karen Mood, MD, [facility], [facility] 01/30/2019, 9:25 AM     Assessment   18 y.o. G2P0010 at [redacted]w[redacted]d by  07/03/2019, by Last Menstrual Period presenting for routine prenatal visit  Plan   Pregnancy#2 Problems (from 09/26/18 to present)    Problem Noted Resolved   BMI 37.0-37.9, adult 12/18/2018 by Karen Bonnet, MD No   Obesity in pregnancy, antepartum 11/12/2018 by Karen Fellers, MD No   Encounter for supervision of normal first pregnancy in first trimester 11/01/2018 by Karen Mood, MD No   Overview Addendum 12/16/2018  8:46 AM by Karen Mood, MD      [facility]inic [facility] Prenatal Labs  Dating  LMP =6wk [place] Blood  type: --/--/O NEG (03/15 1840)   Genetic Screen Normal XY Antibody:NEG (03/15 1840)  Anatomic US Normal Female Rubella: Immune Varicella:  Non-Immune  GTT Early:        28 wk:      RPR: NR  Rhogam  HBsAg: negative  TDaP vaccine                       HIV: negative  Flu Shot                                GBS:   Contraception  Pap: Under 21  CBB     CS/VBAC    Baby Food    Support Person                 Gestational age appropriate obstetric precautions including but not limited to vaginal bleeding, contractions, leaking of fluid and fetal movement were reviewed in detail with the patient.    Return in about 4 weeks (around 02/27/2019) for ROB.  Karen AustriaAndreas Malaiya Paczkowski, MD, Evern CoreFACOG [facility], [facility] 01/30/2019, 9:27 AM

## 2019-01-30 NOTE — Progress Notes (Signed)
I did not Schuman did I beliweve

## 2019-01-30 NOTE — Addendum Note (Signed)
Addended by: [place], Kersten Salmons B on: 01/30/2019 09:44 AM   Modules accepted: Orders

## 2019-01-30 NOTE — Progress Notes (Signed)
I think you ordered this. Not sure why it showed up in my inbox.

## 2019-01-30 NOTE — Progress Notes (Signed)
ROB  Anatomy Scan  Low abdomen cramping

## 2019-02-25 ENCOUNTER — Other Ambulatory Visit: Payer: Self-pay

## 2019-02-25 ENCOUNTER — Ambulatory Visit (INDEPENDENT_AMBULATORY_CARE_PROVIDER_SITE_OTHER): Payer: Managed Care, Other (non HMO) | Admitting: Obstetrics and Gynecology

## 2019-02-25 VITALS — BP 116/60 | Wt 221.0 lb

## 2019-02-25 DIAGNOSIS — Z3A21 21 weeks gestation of pregnancy: Secondary | ICD-10-CM

## 2019-02-25 DIAGNOSIS — Z3401 Encounter for supervision of normal first pregnancy, first trimester: Secondary | ICD-10-CM

## 2019-02-25 DIAGNOSIS — Z3482 Encounter for supervision of other normal pregnancy, second trimester: Secondary | ICD-10-CM

## 2019-02-25 LAB — POCT URINALYSIS DIPSTICK OB
Glucose, UA: NEGATIVE
POC,PROTEIN,UA: NEGATIVE

## 2019-02-25 NOTE — Progress Notes (Signed)
ROB

## 2019-02-25 NOTE — Progress Notes (Signed)
    Routine Prenatal Care Visit  Subjective  Charleston Vierling is a 18 y.o. G2P0010 at [redacted]w[redacted]d being seen today for ongoing prenatal care.  She is currently monitored for the following issues for this low-risk pregnancy and has Menometrorrhagia; Soft tissue swelling of back; Dysmenorrhea; Encounter for supervision of normal first pregnancy in first trimester; Obesity in pregnancy, antepartum; Abnormal yolk sac; and BMI 37.0-37.9, adult on their problem list.  ----------------------------------------------------------------------------------- Patient reports no complaints.   Contractions: Not present. Vag. Bleeding: None.  Movement: Present. Denies leaking of fluid.  ----------------------------------------------------------------------------------- The following portions of the patient's history were reviewed and updated as appropriate: allergies, current medications, past family history, past medical history, past social history, past surgical history and problem list. Problem list updated.   Objective  Blood pressure 116/60, weight 221 lb (100.2 kg), last menstrual period 09/26/2018, unknown if currently breastfeeding. Body mass index is 39.15 kg/m.  Pregravid weight 214 lb (97.1 kg) Total Weight Gain 7 lb (3.175 kg) Urinalysis:      Fetal Status: Fetal Heart Rate (bpm): 140 Fundal Height: 21 cm Movement: Present     General:  Alert, oriented and cooperative. Patient is in no acute distress.  Skin: Skin is warm and dry. No rash noted.   Cardiovascular: Normal heart rate noted  Respiratory: Normal respiratory effort, no problems with respiration noted  Abdomen: Soft, gravid, appropriate for gestational age. Pain/Pressure: Absent     Pelvic:  Cervical exam deferred        Extremities: Normal range of motion.     ental Status: Normal mood and affect. Normal behavior. Normal judgment and thought content.     Assessment   18 y.o. G2P0010 at [redacted]w[redacted]d by  07/03/2019, by Last Menstrual Period  presenting for routine prenatal visit  Plan   Pregnancy#2 Problems (from 09/26/18 to present)    Problem Noted Resolved   BMI 37.0-37.9, adult 12/18/2018 by Will Bonnet, MD No   Obesity in pregnancy, antepartum 11/12/2018 by Homero Fellers, MD No   Encounter for supervision of normal first pregnancy in first trimester 11/01/2018 by Malachy Mood, MD No   Overview Addendum 01/30/2019  9:28 AM by Malachy Mood, MD      [facility]inic [facility] Prenatal Labs  Dating  LMP =6wk [place] Blood type: --/--/O NEG (03/15 1840)   Genetic Screen Normal XY Antibody:NEG (03/15 1840)  Anatomic [place] Normal Female Rubella: Immune Varicella: Non-Immune  GTT Early:        28 wk:      RPR: NR  Rhogam N/A HBsAg: negative  TDaP vaccine                       HIV: negative  Flu Shot                                GBS:   Contraception  Pap: Under 21  CBB     CS/VBAC    Baby Food    Support Person                 Gestational age appropriate obstetric precautions including but not limited to vaginal bleeding, contractions, leaking of fluid and fetal movement were reviewed in detail with the patient.    Return in about 4 weeks (around 03/25/2019) for ROB.  Malachy Mood, MD, [facility], [facility] 02/25/2019, 9:56 AM

## 2019-03-11 ENCOUNTER — Telehealth: Payer: Self-pay | Admitting: Obstetrics and Gynecology

## 2019-03-11 ENCOUNTER — Encounter: Payer: Self-pay | Admitting: Obstetrics and Gynecology

## 2019-03-11 ENCOUNTER — Other Ambulatory Visit: Payer: Self-pay

## 2019-03-11 ENCOUNTER — Ambulatory Visit (INDEPENDENT_AMBULATORY_CARE_PROVIDER_SITE_OTHER): Payer: Managed Care, Other (non HMO) | Admitting: Obstetrics and Gynecology

## 2019-03-11 VITALS — BP 118/70 | Wt 221.0 lb

## 2019-03-11 DIAGNOSIS — Z331 Pregnant state, incidental: Secondary | ICD-10-CM

## 2019-03-11 DIAGNOSIS — Z3A23 23 weeks gestation of pregnancy: Secondary | ICD-10-CM

## 2019-03-11 DIAGNOSIS — N3000 Acute cystitis without hematuria: Secondary | ICD-10-CM

## 2019-03-11 LAB — POCT URINALYSIS DIPSTICK
Bilirubin, UA: NEGATIVE
Blood, UA: NEGATIVE
Glucose, UA: NEGATIVE
Ketones, UA: NEGATIVE
Nitrite, UA: NEGATIVE
Protein, UA: POSITIVE — AB
Spec Grav, UA: 1.01 (ref 1.010–1.025)
Urobilinogen, UA: NEGATIVE E.U./dL — AB
pH, UA: 5 (ref 5.0–8.0)

## 2019-03-11 MED ORDER — NITROFURANTOIN MONOHYD MACRO 100 MG PO CAPS: 100.0000 mg | ORAL_CAPSULE | Freq: Two times a day (BID) | ORAL | 0 refills | Status: AC

## 2019-03-11 NOTE — Progress Notes (Signed)
Patient, No Pcp Per   Chief Complaint  Patient presents with  . Urinary Tract Infection    HPI:      Ms. Karen Woodward is a 18 y.o. G2P0010 who LMP was Patient's last menstrual period was 09/26/2018 (exact date)., presents today for UTI sx of urinary frequency/urgency, LBP since yesterday. Also with some pelvic cramping/discomfort, although not much different than usual. No vaginal bleeding/hematuria or dysuria, no unusual vag sx. No Fevers/chills. Pos fetal movement. Pt is 23 5/[redacted] wks pregnant. Doing well otherwise. Has ROB 03/25/19.  History reviewed. No pertinent past medical history.  Past Surgical History:  Procedure Laterality Date  . TONSILLECTOMY AND ADENOIDECTOMY  06/2014    Family History  Problem Relation Age of Onset  . Hypertension Mother   . Diabetes Maternal Grandfather     Social History   Socioeconomic History  . Marital status: Single    Spouse name: Not on file  . Number of children: 0  . Years of education: 30  . Highest education level: Not on file  Occupational History  . Occupation: EMT  Social Needs  . Financial resource strain: Not on file  . Food insecurity    Worry: Not on file    Inability: Not on file  . Transportation needs    Medical: Not on file    Non-medical: Not on file  Tobacco Use  . Smoking status: Never Smoker  . Smokeless tobacco: Never Used  Substance and Sexual Activity  . Alcohol use: No  . Drug use: No  . Sexual activity: Yes    Birth control/protection: None  Lifestyle  . Physical activity    Days per week: Not on file    Minutes per session: Not on file  . Stress: Not on file  Relationships  . Social Musician on phone: Not on file    Gets together: Not on file    Attends religious service: Not on file    Active member of club or organization: Not on file    Attends meetings of clubs or organizations: Not on file    Relationship status: Not on file  . Intimate partner violence    Fear of  current or ex partner: Not on file    Emotionally abused: Not on file    Physically abused: Not on file    Forced sexual activity: Not on file  Other Topics Concern  . Not on file  Social History Narrative  . Not on file    Outpatient Medications Prior to Visit  Medication Sig Dispense Refill  . Prenatal MV-Min-FA-Omega-3 (PRENATAL GUMMIES/DHA & FA PO) Take by mouth.     No facility-administered medications prior to visit.       ROS:  Review of Systems  Constitutional: Negative for fever.  Gastrointestinal: Negative for blood in stool, constipation, diarrhea, nausea and vomiting.  Genitourinary: Positive for frequency, pelvic pain, urgency and vaginal discharge. Negative for dyspareunia, dysuria, flank pain, hematuria, vaginal bleeding and vaginal pain.  Musculoskeletal: Positive for back pain.  Skin: Negative for rash.    OBJECTIVE:   Vitals:  BP 118/70   Wt 221 lb (100.2 kg)   LMP 09/26/2018 (Exact Date)   BMI 39.15 kg/m   Physical Exam Vitals signs reviewed.  Constitutional:      Appearance: She is well-developed. She is not ill-appearing or toxic-appearing.  Neck:     Musculoskeletal: Normal range of motion.  Pulmonary:     Effort: Pulmonary  effort is normal.  Abdominal:     Tenderness: There is no right CVA tenderness or left CVA tenderness.  Musculoskeletal: Normal range of motion.  Neurological:     General: No focal deficit present.     Mental Status: She is alert and oriented to person, place, and time.     Cranial Nerves: No cranial nerve deficit.  Psychiatric:        Behavior: Behavior normal.        Thought Content: Thought content normal.        Judgment: Judgment normal.     Results: Results for orders placed or performed in visit on 03/11/19 (from the past 24 hour(s))  POCT Urinalysis Dipstick     Status: Abnormal   Collection Time: 03/11/19  3:06 PM  Result Value Ref Range   Color, UA     Clarity, UA     Glucose, UA Negative Negative    Bilirubin, UA neg    Ketones, UA neg    Spec Grav, UA 1.010 1.010 - 1.025   Blood, UA neg    pH, UA 5.0 5.0 - 8.0   Protein, UA Positive (A) Negative   Urobilinogen, UA negative (A) 0.2 or 1.0 E.U./dL   Nitrite, UA neg    Leukocytes, UA Moderate (2+) (A) Negative   Appearance     Odor       Assessment/Plan: Acute cystitis without hematuria - Plan: POCT Urinalysis Dipstick, Urine Culture, nitrofurantoin, macrocrystal-monohydrate, (MACROBID) 100 MG capsule; Pos UA/sx. Rx macrobid. Increase water. Check C&S. F/u if sx change/persist/worsen.  [redacted] weeks gestation of pregnancy   Meds ordered this encounter  Medications  . nitrofurantoin, macrocrystal-monohydrate, (MACROBID) 100 MG capsule    Sig: Take 1 capsule (100 mg total) by mouth 2 (two) times daily for 5 days.    Dispense:  10 capsule    Refill:  0    Order Specific Question:   Supervising Provider    Answer:   Gae Dry [[id number]]      Return if symptoms worsen or fail to improve.  Alicia B. Copland, PA-C 03/11/2019 3:12 PM

## 2019-03-11 NOTE — Progress Notes (Signed)
Pt is having frequency urinating without burning. No blood in urine or odor. Some what of pelvic pain, mainly back and side, since 4 pm yesterday

## 2019-03-11 NOTE — Telephone Encounter (Signed)
OB probably needs to be seen

## 2019-03-11 NOTE — Telephone Encounter (Signed)
Patient is calling with the complaint for lower back pain and cramping. Patient also reports decreased urine out put with no pain. please

## 2019-03-11 NOTE — Telephone Encounter (Signed)
Patient is schedule 03/11/19 with [facility] for UA

## 2019-03-11 NOTE — Telephone Encounter (Signed)
Please advise if pt needs to be seen, [facility] has an opening. [facility] and [facility] do not. Can pt just drop off a urine?

## 2019-03-11 NOTE — Patient Instructions (Signed)
I value your feedback and entrusting us with your care. If you get a  patient survey, I would appreciate you taking the time to let us know about your experience today. Thank you! 

## 2019-03-11 NOTE — Telephone Encounter (Signed)
Please schedule with [facility]. [facility] have no openings. OB UTI??

## 2019-03-13 ENCOUNTER — Telehealth: Payer: Self-pay | Admitting: Obstetrics and Gynecology

## 2019-03-13 LAB — URINE CULTURE

## 2019-03-13 NOTE — Telephone Encounter (Signed)
Spoke to pt

## 2019-03-13 NOTE — Telephone Encounter (Signed)
Back to you

## 2019-03-13 NOTE — Progress Notes (Signed)
Pls call pt and see how she is feeling. C&S neg. She is OB pt.

## 2019-03-13 NOTE — Progress Notes (Signed)
Called pt, no answer.

## 2019-03-13 NOTE — Progress Notes (Signed)
Ok. F/u prn vaginal bleeding/contrxns.

## 2019-03-13 NOTE — Telephone Encounter (Signed)
Patient is calling for labs results. Please advise. 

## 2019-03-25 ENCOUNTER — Encounter: Payer: Managed Care, Other (non HMO) | Admitting: Obstetrics and Gynecology

## 2019-03-31 ENCOUNTER — Other Ambulatory Visit: Payer: Self-pay

## 2019-03-31 ENCOUNTER — Ambulatory Visit (INDEPENDENT_AMBULATORY_CARE_PROVIDER_SITE_OTHER): Payer: Managed Care, Other (non HMO) | Admitting: Obstetrics and Gynecology

## 2019-03-31 ENCOUNTER — Encounter: Payer: Managed Care, Other (non HMO) | Admitting: Obstetrics and Gynecology

## 2019-03-31 VITALS — BP 116/62 | Wt 226.0 lb

## 2019-03-31 DIAGNOSIS — O99212 Obesity complicating pregnancy, second trimester: Secondary | ICD-10-CM

## 2019-03-31 DIAGNOSIS — Z3A26 26 weeks gestation of pregnancy: Secondary | ICD-10-CM

## 2019-03-31 DIAGNOSIS — Z3401 Encounter for supervision of normal first pregnancy, first trimester: Secondary | ICD-10-CM

## 2019-03-31 DIAGNOSIS — O99210 Obesity complicating pregnancy, unspecified trimester: Secondary | ICD-10-CM

## 2019-03-31 LAB — POCT URINALYSIS DIPSTICK OB
Glucose, UA: NEGATIVE
POC,PROTEIN,UA: NEGATIVE

## 2019-03-31 NOTE — Addendum Note (Signed)
Addended by: [place], Oakley Orban B on: 03/31/2019 04:48 PM   Modules accepted: Orders

## 2019-03-31 NOTE — Progress Notes (Signed)
    Routine Prenatal Care Visit  Subjective  Karen Woodward is a 18 y.o. G2P0010 at [redacted]w[redacted]d being seen today for ongoing prenatal care.  She is currently monitored for the following issues for this low-risk pregnancy and has Menometrorrhagia; Soft tissue swelling of back; Dysmenorrhea; Encounter for supervision of normal first pregnancy in first trimester; Obesity in pregnancy, antepartum; Abnormal yolk sac; and BMI 37.0-37.9, adult on their problem list.  ----------------------------------------------------------------------------------- Patient reports no complaints.   Contractions: Not present. Vag. Bleeding: None.  Movement: Present. Denies leaking of fluid.  ----------------------------------------------------------------------------------- The following portions of the patient's history were reviewed and updated as appropriate: allergies, current medications, past family history, past medical history, past social history, past surgical history and problem list. Problem list updated.   Objective  Blood pressure 116/62, weight 226 lb (102.5 kg), last menstrual period 09/26/2018, unknown if currently breastfeeding. Pregravid weight 214 lb (97.1 kg) Total Weight Gain 12 lb (5.443 kg)  Body mass index is 40.03 kg/m.  Urinalysis:      Fetal Status: Fetal Heart Rate (bpm): 140 Fundal Height: 27 cm Movement: Present     General:  Alert, oriented and cooperative. Patient is in no acute distress.  Skin: Skin is warm and dry. No rash noted.   Cardiovascular: Normal heart rate noted  Respiratory: Normal respiratory effort, no problems with respiration noted  Abdomen: Soft, gravid, appropriate for gestational age. Pain/Pressure: Absent     Pelvic:  Cervical exam deferred        Extremities: Normal range of motion.     ental Status: Normal mood and affect. Normal behavior. Normal judgment and thought content.     Assessment   18 y.o. G2P0010 at [redacted]w[redacted]d by  07/03/2019, by Last Menstrual Period  presenting for routine prenatal visit  Plan   Pregnancy#2 Problems (from 09/26/18 to present)    Problem Noted Resolved   BMI 37.0-37.9, adult 12/18/2018 by Will Bonnet, MD No   Obesity in pregnancy, antepartum 11/12/2018 by Homero Fellers, MD No   Encounter for supervision of normal first pregnancy in first trimester 11/01/2018 by Malachy Mood, MD No   Overview Addendum 02/25/2019  9:40 AM by Malachy Mood, MD      Clinic [facility] Prenatal Labs  Dating  LMP =6wk [place] Blood type: --/--/O NEG (03/15 1840)   Genetic Screen Normal XY Antibody:NEG (03/15 1840)  Anatomic [place] Normal Female Rubella: Immune Varicella: Non-Immune  GTT Early: 98   28 wk:      RPR: NR  Rhogam N/A HBsAg: negative  TDaP vaccine                       HIV: negative  Flu Shot                                GBS:   Contraception  Pap: Under 21  CBB     CS/VBAC    Baby Food    Support Person                 Gestational age appropriate obstetric precautions including but not limited to vaginal bleeding, contractions, leaking of fluid and fetal movement were reviewed in detail with the patient.    Return in about 2 weeks (around 04/14/2019) for ROB, 28 week labs, and growth scan.  Malachy Mood, MD, [facility], [facility] 03/31/2019, 4:46 PM

## 2019-03-31 NOTE — Progress Notes (Signed)
ROB

## 2019-04-14 ENCOUNTER — Ambulatory Visit (INDEPENDENT_AMBULATORY_CARE_PROVIDER_SITE_OTHER): Payer: Managed Care, Other (non HMO)

## 2019-04-14 ENCOUNTER — Ambulatory Visit (INDEPENDENT_AMBULATORY_CARE_PROVIDER_SITE_OTHER): Payer: Managed Care, Other (non HMO) | Admitting: Obstetrics and Gynecology

## 2019-04-14 ENCOUNTER — Other Ambulatory Visit: Payer: Managed Care, Other (non HMO)

## 2019-04-14 ENCOUNTER — Other Ambulatory Visit: Payer: Self-pay

## 2019-04-14 VITALS — BP 130/66 | Wt 232.0 lb

## 2019-04-14 DIAGNOSIS — O99213 Obesity complicating pregnancy, third trimester: Secondary | ICD-10-CM

## 2019-04-14 DIAGNOSIS — O99210 Obesity complicating pregnancy, unspecified trimester: Secondary | ICD-10-CM

## 2019-04-14 DIAGNOSIS — Z3A28 28 weeks gestation of pregnancy: Secondary | ICD-10-CM

## 2019-04-14 DIAGNOSIS — Z3401 Encounter for supervision of normal first pregnancy, first trimester: Secondary | ICD-10-CM

## 2019-04-14 DIAGNOSIS — Z3A26 26 weeks gestation of pregnancy: Secondary | ICD-10-CM

## 2019-04-14 NOTE — Progress Notes (Signed)
ROB °Growth scan  °28 week labs °

## 2019-04-14 NOTE — Progress Notes (Signed)
Routine Prenatal Care Visit  Subjective  Karen Woodward is a 18 y.o. G2P0010 at [redacted]w[redacted]d being seen today for ongoing prenatal care.  She is currently monitored for the following issues for this high-risk pregnancy and has Menometrorrhagia; Soft tissue swelling of back; Dysmenorrhea; Encounter for supervision of normal first pregnancy in first trimester; Obesity in pregnancy, antepartum; Abnormal yolk sac; and BMI 37.0-37.9, adult on their problem list.  ----------------------------------------------------------------------------------- Patient reports no complaints.   Contractions: Not present. Vag. Bleeding: None.  Movement: Present. Denies leaking of fluid.  ----------------------------------------------------------------------------------- The following portions of the patient's history were reviewed and updated as appropriate: allergies, current medications, past family history, past medical history, past social history, past surgical history and problem list. Problem list updated.   Objective  Blood pressure 130/66, weight 232 lb (105.2 kg), last menstrual period 09/26/2018, unknown if currently breastfeeding. Pregravid weight 214 lb (97.1 kg) Total Weight Gain 18 lb (8.165 kg)  Body mass index is 41.1 kg/m.  Urinalysis:      Fetal Status: Fetal Heart Rate (bpm): 140 Fundal Height: 29 cm Movement: Present     General:  Alert, oriented and cooperative. Patient is in no acute distress.  Skin: Skin is warm and dry. No rash noted.   Cardiovascular: Normal heart rate noted  Respiratory: Normal respiratory effort, no problems with respiration noted  Abdomen: Soft, gravid, appropriate for gestational age. Pain/Pressure: Absent     Pelvic:  Cervical exam deferred        Extremities: Normal range of motion.     ental Status: Normal mood and affect. Normal behavior. Normal judgment and thought content.   US Ob Follow Up  Result Date: 04/14/2019 Patient Name: Karen Woodward DOB:  01/28/2001 MRN: [id number] ULTRASOUND REPORT Location: [facility] Date of Service: 04/14/2019 Indications:growth/afi Findings: Mason Jim intrauterine pregnancy is visualized with FHR at 144 BPM. Biometrics give an (U/S) Gestational age of [redacted]w[redacted]d and an (U/S) EDD of 06/27/2019; this correlates with the clinically established Estimated Date of Delivery: 07/03/19. Fetal presentation is Cephalic. Placenta: anterior. Grade: 1 AFI: 10.4 cm Growth percentile is 61.8% EFW: 3 lb 0 oz Impression: 1. [redacted]w[redacted]d Viable Singleton Intrauterine pregnancy previously established criteria. 2. Growth is 61.8 %ile.  AFI is 10.4 cm. Recommendations: 1.Clinical correlation with the patient's History and Physical Exam. Deanna Artis, RT There is a singleton gestation with normal amniotic fluid volume. The fetal biometry correlates with established dating.  Limited fetal anatomy was performed.The visualized fetal anatomical survey appears within normal limits within the resolution of ultrasound as described above.  It must be noted that a normal ultrasound is unable to rule out fetal aneuploidy.  Vena Austria, MD, [facility], [facility] 04/14/2019, 10:00 AM     Assessment   18 y.o. G2P0010 at [redacted]w[redacted]d by  07/03/2019, by Last Menstrual Period presenting for routine prenatal visit  Plan   Pregnancy#2 Problems (from 09/26/18 to present)    Problem Noted Resolved   BMI 37.0-37.9, adult 12/18/2018 by Conard Novak, MD No   Obesity in pregnancy, antepartum 11/12/2018 by Natale Milch, MD No   Encounter for supervision of normal first pregnancy in first trimester 11/01/2018 by Vena Austria, MD No   Overview Addendum 02/25/2019  9:40 AM by Vena Austria, MD      Clinic [facility] Prenatal Labs  Dating  LMP =6wk [place] Blood type: --/--/O NEG (03/15 1840)   Genetic Screen Normal XY Antibody:NEG (03/15 1840)  Anatomic [place] Normal Female Rubella: Immune  Varicella: Non-Immune  GTT Early: 98   28  wk:      RPR: NR  Rhogam N/A HBsAg: negative  TDaP vaccine                       HIV: negative  Flu Shot                                GBS:   Contraception  Pap: Under 21  CBB     CS/VBAC    Baby Food    Support Person                 Gestational age appropriate obstetric precautions including but not limited to vaginal bleeding, contractions, leaking of fluid and fetal movement were reviewed in detail with the patient.    - 28 week labs today - normal growth scan  Return in about 2 weeks (around 04/28/2019) for ROB.  Malachy Mood, MD, [facility], [facility] 04/14/2019, 10:16 AM

## 2019-04-15 LAB — 28 WEEK RH+PANEL
Basophils Absolute: 0 10*3/uL (ref 0.0–0.2)
Basos: 0 %
EOS (ABSOLUTE): 0.1 10*3/uL (ref 0.0–0.4)
Eos: 1 %
Gestational Diabetes Screen: 133 mg/dL (ref 65–139)
HIV Screen 4th Generation wRfx: NONREACTIVE
Hematocrit: 33 % — ABNORMAL LOW (ref 34.0–46.6)
Hemoglobin: 10.9 g/dL — ABNORMAL LOW (ref 11.1–15.9)
Immature Grans (Abs): 0.1 10*3/uL (ref 0.0–0.1)
Immature Granulocytes: 1 %
Lymphocytes Absolute: 1.3 10*3/uL (ref 0.7–3.1)
Lymphs: 12 %
MCH: 26.8 pg (ref 26.6–33.0)
MCHC: 33 g/dL (ref 31.5–35.7)
MCV: 81 fL (ref 79–97)
Monocytes Absolute: 0.7 10*3/uL (ref 0.1–0.9)
Monocytes: 6 %
Neutrophils Absolute: 8.8 10*3/uL — ABNORMAL HIGH (ref 1.4–7.0)
Neutrophils: 80 %
Platelets: 308 10*3/uL (ref 150–450)
RBC: 4.06 x10E6/uL (ref 3.77–5.28)
RDW: 14.1 % (ref 11.7–15.4)
RPR Ser Ql: NONREACTIVE
WBC: 11.1 10*3/uL — ABNORMAL HIGH (ref 3.4–10.8)

## 2019-04-28 ENCOUNTER — Ambulatory Visit (INDEPENDENT_AMBULATORY_CARE_PROVIDER_SITE_OTHER): Payer: Managed Care, Other (non HMO) | Admitting: Obstetrics and Gynecology

## 2019-04-28 ENCOUNTER — Other Ambulatory Visit: Payer: Self-pay

## 2019-04-28 VITALS — BP 128/66 | Wt 233.0 lb

## 2019-04-28 DIAGNOSIS — Z23 Encounter for immunization: Secondary | ICD-10-CM

## 2019-04-28 DIAGNOSIS — Z3401 Encounter for supervision of normal first pregnancy, first trimester: Secondary | ICD-10-CM

## 2019-04-28 DIAGNOSIS — O99213 Obesity complicating pregnancy, third trimester: Secondary | ICD-10-CM

## 2019-04-28 DIAGNOSIS — Z3A30 30 weeks gestation of pregnancy: Secondary | ICD-10-CM

## 2019-04-28 DIAGNOSIS — O99210 Obesity complicating pregnancy, unspecified trimester: Secondary | ICD-10-CM

## 2019-04-28 LAB — POCT URINALYSIS DIPSTICK OB: POC,PROTEIN,UA: NEGATIVE

## 2019-04-28 NOTE — Progress Notes (Signed)
Routine Prenatal Care Visit  Subjective  Karen Woodward is a 18 y.o. G2P0010 at [redacted]w[redacted]d being seen today for ongoing prenatal care.  She is currently monitored for the following issues for this high-risk pregnancy and has Menometrorrhagia; Soft tissue swelling of back; Dysmenorrhea; Encounter for supervision of normal first pregnancy in first trimester; Obesity in pregnancy, antepartum; Abnormal yolk sac; and BMI 37.0-37.9, adult on their problem list.  ----------------------------------------------------------------------------------- Patient reports no complaints.   Contractions: Not present. Vag. Bleeding: None.  Movement: Present. Denies leaking of fluid.  ----------------------------------------------------------------------------------- The following portions of the patient's history were reviewed and updated as appropriate: allergies, current medications, past family history, past medical history, past social history, past surgical history and problem list. Problem list updated.   Objective  Blood pressure 128/66, weight 233 lb (105.7 kg), last menstrual period 09/26/2018, unknown if currently breastfeeding. Pregravid weight 214 lb (97.1 kg) Total Weight Gain 19 lb (8.618 kg)  Body mass index is 41.27 kg/m.  Urinalysis:      Fetal Status: Fetal Heart Rate (bpm): 145   Movement: Present     General:  Alert, oriented and cooperative. Patient is in no acute distress.  Skin: Skin is warm and dry. No rash noted.   Cardiovascular: Normal heart rate noted  Respiratory: Normal respiratory effort, no problems with respiration noted  Abdomen: Soft, gravid, appropriate for gestational age. Pain/Pressure: Absent     Pelvic:  Cervical exam deferred        Extremities: Normal range of motion.     ental Status: Normal mood and affect. Normal behavior. Normal judgment and thought content.   Immunization History  Administered Date(s) Administered  . Tdap 04/28/2019     Assessment   18  y.o. G2P0010 at [redacted]w[redacted]d by  07/03/2019, by Last Menstrual Period presenting for routine prenatal visit  Plan   Pregnancy#2 Problems (from 09/26/18 to present)    Problem Noted Resolved   BMI 37.0-37.9, adult 12/18/2018 by Conard Novak, MD No   Obesity in pregnancy, antepartum 11/12/2018 by Natale Milch, MD No   Overview Signed 04/14/2019 10:15 AM by Vena Austria, MD    28 week growth 3lbs 0oz 61.8%ile with AFI 10.4cm      Encounter for supervision of normal first pregnancy in first trimester 11/01/2018 by Vena Austria, MD No   Overview Addendum 02/25/2019  9:40 AM by Vena Austria, MD      Clinic [facility] Prenatal Labs  Dating  LMP =6wk [place] Blood type: --/--/O NEG (03/15 1840)   Genetic Screen Normal XY Antibody:NEG (03/15 1840)  Anatomic [place] Normal Female Rubella: Immune Varicella: Non-Immune  GTT Early: 98   28 wk: 133     RPR: NR  Rhogam N/A HBsAg: negative  TDaP vaccine 04/28/2019                 HIV: negative  Flu Shot                                GBS:   Contraception  Pap: Under 21  CBB     CS/VBAC N/A   Baby Food    Support Person                 Gestational age appropriate obstetric precautions including but not limited to vaginal bleeding, contractions, leaking of fluid and fetal movement were reviewed in detail with the patient.    - samples citranatal  90 DHA, citranatal [place], and concept DHA provided still on gummies so will start with parental with iron given only mild anemia on 28 week labs - growth scan next visit for BMI >40  Return in about 2 weeks (around 05/12/2019) for ROB and growth.  Malachy Mood, MD, [facility], [facility] 04/28/2019, 11:18 AM

## 2019-04-28 NOTE — Patient Instructions (Addendum)
Pt received TDAP in R Deltoid. Tolerated well

## 2019-04-28 NOTE — Progress Notes (Signed)
ROB TDAP given 

## 2019-04-29 ENCOUNTER — Telehealth: Payer: Self-pay

## 2019-04-29 NOTE — Telephone Encounter (Signed)
FMLA/DISABILITY form for [employer] filled out, signature obtained and given to KT for processing. 

## 2019-05-09 ENCOUNTER — Encounter: Payer: Self-pay | Admitting: *Deleted

## 2019-05-09 ENCOUNTER — Other Ambulatory Visit: Payer: Self-pay

## 2019-05-09 ENCOUNTER — Observation Stay
Admission: EM | Admit: 2019-05-09 | Discharge: 2019-05-09 | Disposition: A | Discharge status: Home / Self Care | Payer: Managed Care, Other (non HMO) | Attending: Obstetrics and Gynecology | Admitting: Obstetrics and Gynecology

## 2019-05-09 DIAGNOSIS — Z3A32 32 weeks gestation of pregnancy: Secondary | ICD-10-CM | POA: Insufficient documentation

## 2019-05-09 DIAGNOSIS — Z3401 Encounter for supervision of normal first pregnancy, first trimester: Secondary | ICD-10-CM

## 2019-05-09 DIAGNOSIS — N898 Other specified noninflammatory disorders of vagina: Secondary | ICD-10-CM

## 2019-05-09 DIAGNOSIS — O99213 Obesity complicating pregnancy, third trimester: Secondary | ICD-10-CM | POA: Insufficient documentation

## 2019-05-09 DIAGNOSIS — O4703 False labor before 37 completed weeks of gestation, third trimester: Secondary | ICD-10-CM | POA: Diagnosis not present

## 2019-05-09 DIAGNOSIS — Z6837 Body mass index (BMI) 37.0-37.9, adult: Secondary | ICD-10-CM

## 2019-05-09 DIAGNOSIS — O99210 Obesity complicating pregnancy, unspecified trimester: Secondary | ICD-10-CM

## 2019-05-09 DIAGNOSIS — O26893 Other specified pregnancy related conditions, third trimester: Secondary | ICD-10-CM | POA: Diagnosis present

## 2019-05-09 NOTE — Discharge Summary (Signed)
See Final Progress Note 

## 2019-05-09 NOTE — ED Notes (Signed)
[facility], G1P0

## 2019-05-09 NOTE — Final Progress Note (Signed)
Physician Final Progress Note  Patient ID: Karen Woodward MRN: [id number] DOB/AGE: 12-Oct-2000 18 y.o.  Admit date: 05/09/2019 Admitting provider: Conard Novak, MD Discharge date: 05/09/2019   Admission Diagnoses:  1) intrauterine pregnancy at 105w1d  2) concern for leaking fluid  Discharge Diagnoses:  1) intrauterine pregnancy at [redacted]w[redacted]d  2) concern for leaking fluid - no evidence of leaking fluid.  History of Present Illness: The patient is a 18 y.o. female G2P0010 at [redacted]w[redacted]d by LMP consistent with a 6 week ultrasound who presents for concern for leaking fluid since yesterday. Her pregnancy has been complicated by obesity with bmi ~40, abnormally large yolk sac early in pregnancy. She was sitting on the toilet yesterday and felt an abnormal "gush" fluid at that time. The fluid was clear and water-like.  She has had no ongoing leaking of fluid. She has had some discharge of mucoid material. She denies vaginal bleeding. She notes +FM. She notes braxton-hicks contractions. Of note, she had recently had intercourse just before she had this gush of fluid.   Past Medical History:  Diagnosis Date  . Medical history non-contributory     Past Surgical History:  Procedure Laterality Date  . TONSILLECTOMY AND ADENOIDECTOMY  06/2014    No current facility-administered medications on file prior to encounter.    Current Outpatient Medications on File Prior to Encounter  Medication Sig Dispense Refill  . Prenatal MV-Min-FA-Omega-3 (PRENATAL GUMMIES/DHA & FA PO) Take by mouth.      No Known Allergies  Social History   Socioeconomic History  . Marital status: Single    Spouse name: Not on file  . Number of children: 0  . Years of education: 69  . Highest education level: Not on file  Occupational History  . Occupation: EMT  Social Needs  . Financial resource strain: Not on file  . Food insecurity    Worry: Not on file    Inability: Not on file  . Transportation needs   Medical: Not on file    Non-medical: Not on file  Tobacco Use  . Smoking status: Never Smoker  . Smokeless tobacco: Never Used  Substance and Sexual Activity  . Alcohol use: No  . Drug use: No  . Sexual activity: Yes    Birth control/protection: None  Lifestyle  . Physical activity    Days per week: Not on file    Minutes per session: Not on file  . Stress: Not on file  Relationships  . Social Musician on phone: Not on file    Gets together: Not on file    Attends religious service: Not on file    Active member of club or organization: Not on file    Attends meetings of clubs or organizations: Not on file    Relationship status: Not on file  . Intimate partner violence    Fear of current or ex partner: Not on file    Emotionally abused: Not on file    Physically abused: Not on file    Forced sexual activity: Not on file  Other Topics Concern  . Not on file  Social History Narrative  . Not on file    Family History  Problem Relation Age of Onset  . Hypertension Mother   . Diabetes Maternal Grandfather      Review of Systems  Constitutional: Negative.   HENT: Negative.   Eyes: Negative.   Respiratory: Negative.   Cardiovascular: Negative.   Gastrointestinal: Negative.  Genitourinary: Negative.        See HPI  Musculoskeletal: Negative.   Skin: Negative.   Neurological: Negative.   Psychiatric/Behavioral: Negative.      Physical Exam: Ht 5\' 3"  (1.6 m)   Wt 105.7 kg   LMP 09/26/2018 (Exact Date)   BMI 41.27 kg/m   Physical Exam Constitutional:      General: She is not in acute distress.    Appearance: Normal appearance.  Genitourinary:     Pelvic exam was performed with patient in the lithotomy position.     Vulva, inguinal canal, urethra, bladder and vagina normal.     No cervical bleeding.     Genitourinary Comments: SSE: no pooling. Cervix is visually closed with mucous at the external os. The vagina is totally normal appearing and no  leaking of fluid even with valsalva.   HENT:     Head: Normocephalic and atraumatic.  Eyes:     General: No scleral icterus.    Conjunctiva/sclera: Conjunctivae normal.  Neurological:     General: No focal deficit present.     Mental Status: She is alert and oriented to person, place, and time.     Cranial Nerves: No cranial nerve deficit.  Psychiatric:        Mood and Affect: Mood normal.        Behavior: Behavior normal.        Judgment: Judgment normal.  Vitals signs reviewed. Exam conducted with a chaperone present.   Female chaperone present for pelvic exam:   Consults: None  Significant Findings/ Diagnostic Studies:  Pooling negative Ferning negative  Procedures:  NST: Baseline FHR: 130 beats/min Variability: moderate Accelerations: present Decelerations: absent Tocometry: occasional  Interpretation:  INDICATIONS: rule out uterine contractions RESULTS:  A NST procedure was performed with FHR monitoring and a normal baseline established, appropriate time of 20-40 minutes of evaluation, and accels >2 seen w 15x15 characteristics.  Results show a REACTIVE NST.    Hospital Course: The patient was admitted to Labor and Delivery Triage for observation. She had normal vital signs. The fetal testing was a normal NST. She had no evidence of rupture of membranes. I did offer a ROM+ test to her, which was collected on the initial exam. However, if this were to come back as positive, I would probably repeat the test, given the absolute lack of evidence of rupture of membranes on exam. She states that she feels comfortable going home.  She was given precautions that if she noted any gushing or leaking watery fluid, she should return to L&D immediately and not wait until her next appointment, unless her next appointment was that day and only a couple of hours away.  She voiced understanding and agreement with this plan. Again, ROM+ was offered and she declined. I also do not think this is  necessary given findings on exam.  Thus reassured, she was discharged to home.   Discharge Condition: stable  Disposition: Discharge disposition: 01-Home or Self Care       Diet: Regular diet  Discharge Activity: Activity as tolerated   Allergies as of 05/09/2019   No Known Allergies     Medication List    TAKE these medications   PRENATAL GUMMIES/DHA & FA PO Take by mouth.        Total time spent taking care of this patient: 30 minutes  Signed: Prentice Docker, MD  05/09/2019, 2:10 PM

## 2019-05-09 NOTE — OB Triage Note (Signed)
Thinks her water broke. Karen Woodward

## 2019-05-13 NOTE — Telephone Encounter (Signed)
Pt called back waiting on someone to call her

## 2019-05-15 ENCOUNTER — Ambulatory Visit: Payer: Managed Care, Other (non HMO)

## 2019-05-15 ENCOUNTER — Encounter: Payer: Managed Care, Other (non HMO) | Admitting: Obstetrics and Gynecology

## 2019-05-28 ENCOUNTER — Encounter: Payer: Self-pay | Admitting: Obstetrics & Gynecology

## 2019-05-28 ENCOUNTER — Ambulatory Visit (INDEPENDENT_AMBULATORY_CARE_PROVIDER_SITE_OTHER): Payer: Managed Care, Other (non HMO)

## 2019-05-28 ENCOUNTER — Other Ambulatory Visit: Payer: Self-pay

## 2019-05-28 ENCOUNTER — Ambulatory Visit (INDEPENDENT_AMBULATORY_CARE_PROVIDER_SITE_OTHER): Payer: Managed Care, Other (non HMO) | Admitting: Obstetrics & Gynecology

## 2019-05-28 VITALS — BP 120/80 | Wt 234.0 lb

## 2019-05-28 DIAGNOSIS — O99213 Obesity complicating pregnancy, third trimester: Secondary | ICD-10-CM

## 2019-05-28 DIAGNOSIS — Z3401 Encounter for supervision of normal first pregnancy, first trimester: Secondary | ICD-10-CM

## 2019-05-28 DIAGNOSIS — Z362 Encounter for other antenatal screening follow-up: Secondary | ICD-10-CM

## 2019-05-28 DIAGNOSIS — Z3A34 34 weeks gestation of pregnancy: Secondary | ICD-10-CM

## 2019-05-28 LAB — POCT URINALYSIS DIPSTICK OB: Glucose, UA: NEGATIVE

## 2019-05-28 NOTE — Patient Instructions (Signed)
Group B Streptococcus Infection During Pregnancy  Group B Streptococcus (GBS) is a type of bacteria (Streptococcus agalactiae) that is often found in healthy people, commonly in the rectum, vagina, and intestines. In people who are healthy and not pregnant, the bacteria rarely cause serious illness or complications. However, women who test positive for GBS during pregnancy can pass the bacteria to their baby during childbirth, which can cause serious infection in the baby after birth. Women with GBS may also have infections during their pregnancy or immediately after childbirth, such as urinary tract infections (UTIs) or infections of the uterus (uterine infections). Having GBS also increases a woman's risk of complications during pregnancy, such as early (preterm) labor or delivery, miscarriage, or stillbirth. Routine testing (screening) for GBS is recommended for all pregnant women. What increases the risk? You may have a higher risk for GBS infection during pregnancy if you had one during a past pregnancy. What are the signs or symptoms? In most cases, GBS infection does not cause symptoms in pregnant women. Signs and symptoms of a possible GBS-related infection may include:  Labor starting before the 37th week of pregnancy.  A UTI or bladder infection, which may cause: ? Fever. ? Pain or burning during urination. ? Frequent urination.  Fever during labor, along with: ? Bad-smelling discharge. ? Uterine tenderness. ? Rapid heartbeat in the mother, baby, or both. Rare but serious symptoms of a possible GBS-related infection in women include:  Blood infection (septicemia). This may cause fever, chills, or confusion.  Lung infection (pneumonia). This may cause fever, chills, cough, rapid breathing, difficulty breathing, or chest pain.  Bone, joint, skin, or soft tissue infection. How is this diagnosed? You may be screened for GBS between week 35 and week 37 of your pregnancy. If you have  symptoms of preterm labor, you may be screened earlier. This condition is diagnosed based on lab test results from:  A swab of fluid from the vagina and rectum.  A urine sample. How is this treated? This condition is treated with antibiotic medicine. When you go into labor, or as soon as your water breaks (your membranes rupture), you will be given antibiotics through an IV tube. Antibiotics will continue until after you give birth. If you are having a cesarean delivery, you do not need antibiotics unless your membranes have already ruptured. Follow these instructions at home:  Take over-the-counter and prescription medicines only as told by your health care provider.  Take your antibiotic medicine as told by your health care provider. Do not stop taking the antibiotic even if you start to feel better.  Keep all pre-birth (prenatal) visits and follow-up visits as told by your health care provider. This is important. Contact a health care provider if:  You have pain or burning when you urinate.  You have to urinate frequently.  You have a fever or chills.  You develop a bad-smelling vaginal discharge. Get help right away if:  Your membranes rupture.  You go into labor.  You have severe pain in your abdomen.  You have difficulty breathing.  You have chest pain. This information is not intended to replace advice given to you by your health care provider. Make sure you discuss any questions you have with your health care provider. Document Released: 09/05/2007 Document Revised: 09/19/2018 Document Reviewed: 12/23/2015 Elsevier Patient Education  2020 [employer]  

## 2019-05-28 NOTE — Progress Notes (Signed)
  Subjective  Fetal Movement? yes Contractions? no Leaking Fluid? no Vaginal Bleeding? no  Objective  BP 120/80   Wt 234 lb (106.1 kg)   LMP 09/26/2018 (Exact Date)   BMI 41.45 kg/m  General: NAD Pumonary: no increased work of breathing Abdomen: gravid, non-tender Extremities: no edema Psychiatric: mood appropriate, affect full  Assessment  18 y.o. G2P0010 at [redacted]w[redacted]d by  07/03/2019, by Last Menstrual Period presenting for routine prenatal visit  Plan   Problem List Items Addressed This Visit      Other   Encounter for supervision of normal first pregnancy in first trimester    Other Visit Diagnoses    [redacted] weeks gestation of pregnancy    -  Primary   Relevant Orders   POC Urinalysis Dipstick OB (Completed)      Pregnancy#2 Problems (from 09/26/18 to present)    Problem Noted Resolved   BMI 37.0-37.9, adult 12/18/2018 by Will Bonnet, MD No   Obesity in pregnancy, antepartum 11/12/2018 by Homero Fellers, MD No   Overview Signed 04/14/2019 10:15 AM by Malachy Mood, MD    28 week growth 3lbs 0oz 61.8%ile with AFI 10.4cm      Encounter for supervision of normal first pregnancy in first trimester 11/01/2018 by Malachy Mood, MD No   Overview Addendum 05/28/2019  3:04 PM by Gae Dry, MD      [facility]inic [facility] Prenatal Labs  Dating  LMP =6wk [place] Blood type: --/--/O NEG (03/15 1840)   Genetic Screen Normal XY Antibody:NEG (03/15 1840)  Anatomic [place] Normal Female Rubella: Immune Varicella: Non-Immune  GTT Early: 98   28 wk: 233     RPR: NR  Rhogam N/A HBsAg: negative  TDaP vaccine 04/28/2019                    HIV: negative  Flu Shot            declines                    GBS:   Contraception unsure Pap: Under 21  CBB  no   CS/VBAC N/A   Baby Food Breast   Support Person               Review of ULTRASOUND.    I have personally reviewed images and report of recent ultrasound done at [facility].    Plan of management to be discussed with patient.    Grpwth normal  Barnett Applebaum, MD, [facility], [facility] 05/28/2019  3:04 PM

## 2019-06-11 ENCOUNTER — Other Ambulatory Visit (HOSPITAL_COMMUNITY)
Admission: RE | Admit: 2019-06-11 | Discharge: 2019-06-11 | Disposition: A | Discharge status: Home / Self Care | Payer: Managed Care, Other (non HMO) | Source: Ambulatory Visit | Attending: Obstetrics and Gynecology | Admitting: Obstetrics and Gynecology

## 2019-06-11 ENCOUNTER — Ambulatory Visit (INDEPENDENT_AMBULATORY_CARE_PROVIDER_SITE_OTHER): Payer: Managed Care, Other (non HMO) | Admitting: Obstetrics and Gynecology

## 2019-06-11 ENCOUNTER — Other Ambulatory Visit: Payer: Self-pay

## 2019-06-11 ENCOUNTER — Observation Stay
Admission: EM | Admit: 2019-06-11 | Discharge: 2019-06-11 | Disposition: A | Discharge status: Home / Self Care | Payer: Managed Care, Other (non HMO) | Attending: Certified Nurse Midwife | Admitting: Certified Nurse Midwife

## 2019-06-11 ENCOUNTER — Encounter: Payer: Self-pay | Admitting: Obstetrics and Gynecology

## 2019-06-11 VITALS — BP 144/88

## 2019-06-11 DIAGNOSIS — Z3A00 Weeks of gestation of pregnancy not specified: Secondary | ICD-10-CM | POA: Insufficient documentation

## 2019-06-11 DIAGNOSIS — O163 Unspecified maternal hypertension, third trimester: Principal | ICD-10-CM | POA: Diagnosis present

## 2019-06-11 DIAGNOSIS — O26893 Other specified pregnancy related conditions, third trimester: Secondary | ICD-10-CM

## 2019-06-11 DIAGNOSIS — Z3A36 36 weeks gestation of pregnancy: Secondary | ICD-10-CM

## 2019-06-11 DIAGNOSIS — Z113 Encounter for screening for infections with a predominantly sexual mode of transmission: Secondary | ICD-10-CM | POA: Insufficient documentation

## 2019-06-11 DIAGNOSIS — Z3401 Encounter for supervision of normal first pregnancy, first trimester: Secondary | ICD-10-CM | POA: Insufficient documentation

## 2019-06-11 DIAGNOSIS — Z3685 Encounter for antenatal screening for Streptococcus B: Secondary | ICD-10-CM

## 2019-06-11 DIAGNOSIS — O99210 Obesity complicating pregnancy, unspecified trimester: Secondary | ICD-10-CM

## 2019-06-11 DIAGNOSIS — Z3483 Encounter for supervision of other normal pregnancy, third trimester: Secondary | ICD-10-CM

## 2019-06-11 DIAGNOSIS — Z6837 Body mass index (BMI) 37.0-37.9, adult: Secondary | ICD-10-CM

## 2019-06-11 HISTORY — DX: Anemia, unspecified: D64.9

## 2019-06-11 HISTORY — DX: Morbid (severe) obesity due to excess calories: E66.01

## 2019-06-11 LAB — TYPE AND SCREEN
ABO/RH(D): O NEG
Antibody Screen: NEGATIVE

## 2019-06-11 LAB — COMPREHENSIVE METABOLIC PANEL
ALT: 14 U/L (ref 0–44)
AST: 17 U/L (ref 15–41)
Albumin: 2.8 g/dL — ABNORMAL LOW (ref 3.5–5.0)
Alkaline Phosphatase: 146 U/L — ABNORMAL HIGH (ref 38–126)
Anion gap: 9 (ref 5–15)
BUN: 10 mg/dL (ref 6–20)
CO2: 18 mmol/L — ABNORMAL LOW (ref 22–32)
Calcium: 8.6 mg/dL — ABNORMAL LOW (ref 8.9–10.3)
Chloride: 108 mmol/L (ref 98–111)
Creatinine, Ser: 0.51 mg/dL (ref 0.44–1.00)
GFR calc Af Amer: >60 mL/min (ref >60)
GFR calc non Af Amer: >60 mL/min (ref >60)
Glucose, Bld: 65 mg/dL — ABNORMAL LOW (ref 70–99)
Potassium: 3.9 mmol/L (ref 3.5–5.1)
Sodium: 135 mmol/L (ref 135–145)
Total Bilirubin: 0.4 mg/dL (ref 0.3–1.2)
Total Protein: 6.8 g/dL (ref 6.5–8.1)

## 2019-06-11 LAB — CBC
HCT: 32.7 % — ABNORMAL LOW (ref 36.0–46.0)
Hemoglobin: 10.4 g/dL — ABNORMAL LOW (ref 12.0–15.0)
MCH: 25.7 pg — ABNORMAL LOW (ref 26.0–34.0)
MCHC: 31.8 g/dL (ref 30.0–36.0)
MCV: 80.9 fL (ref 80.0–100.0)
Platelets: 258 10*3/uL (ref 150–400)
RBC: 4.04 MIL/uL (ref 3.87–5.11)
RDW: 14.5 % (ref 11.5–15.5)
WBC: 11.4 10*3/uL — ABNORMAL HIGH (ref 4.0–10.5)
nRBC: 0 % (ref 0.0–0.2)

## 2019-06-11 LAB — POCT URINALYSIS DIPSTICK OB: Glucose, UA: NEGATIVE

## 2019-06-11 LAB — PROTEIN / CREATININE RATIO, URINE
Creatinine, Urine: 115 mg/dL
Protein Creatinine Ratio: 0.2 mg/mg{Cre} — ABNORMAL HIGH (ref 0.00–0.15)
Total Protein, Urine: 23 mg/dL

## 2019-06-11 LAB — OB RESULTS CONSOLE GC/CHLAMYDIA: Gonorrhea: NEGATIVE

## 2019-06-11 NOTE — Progress Notes (Signed)
ROB GBS/Aptima 

## 2019-06-11 NOTE — Final Progress Note (Signed)
Physician Final Progress Note  Patient ID: Karen Woodward MRN: [id number] DOB/AGE: [birth year] 20 y.o.  Admit date: 06/11/2019 Admitting provider: Conard Novak, MD/Keijuan Schellhase L. Sharen Hones, CNM Discharge date: 06/16/2019   Admission Diagnoses: Elevated blood pressure in third trimester  Discharge Diagnoses:  Active Problems:   Elevated blood pressure complicating pregnancy in third trimester, antepartum   Consults: None  Significant Findings/ Diagnostic Studies:   HPI:  Karen Woodward is a 18 y.o. G64P0010 female with EDC=07/03/2019 at [redacted]w[redacted]d dated by LMP=6wk4d.  Her pregnancy has been complicated by obesity with current BMI 42.51 kg/m2.  She presents to L&D for evaluation of elevated blood pressure in office of 144/88. She had a headache earlier in the office, but no current headache, epigastric pain, nausea/vomiting, chest pain or SOB. Has had intermittent scintillating scotomata. Baby active. No bleeding, regular contractions,  or leakage of fluid. Prenatal care site: Prenatal care at [facility] has also  been remarkable for a weight gain of 26# and the following.  [facility]inic [facility] Prenatal Labs  Dating  LMP =6wk [place] Blood type: --/--/O NEG (03/15 1840)   Genetic Screen Normal XY Antibody:NEG (03/15 1840)  Anatomic [place] Normal Female Rubella: Immune Varicella: Non-Immune  GTT Early: 98   28 wk: 133     RPR: NR  Rhogam N/A HBsAg: negative  TDaP vaccine 04/28/2019                    HIV: negative  Flu Shot            declines                    [id number]  Contraception unsure Pap: Under 21  CBB  no   CS/VBAC N/A   Baby Food Breast   Support Person           Maternal Medical History:   Past Medical History:  Diagnosis Date  . Anemia   . Morbid obesity with BMI of 40.0-44.9, adult [facility])     Past Surgical History:  Procedure Laterality Date  . TONSILLECTOMY AND ADENOIDECTOMY  06/2014    No Known Allergies  Prior to Admission medications   Medication Sig  Start Date End Date Taking? Authorizing Provider  Prenatal [id number]-FA-Omega-3 (PRENATAL GUMMIES/DHA & FA PO) Take by mouth.   Yes [provider]          Social History: She  reports that she has never smoked. She has never used smokeless tobacco. She reports that she does not drink alcohol or use drugs.  Family History: family history includes Diabetes in her maternal grandfather; Hypertension in her mother.   Review of Systems: Negative x 10 systems reviewed except as noted in the HPI.      Physical Exam:  Vital Signs: BP 131/85   Pulse 94   Temp 98.4 F (36.9 C) (Oral)   Resp 16   Ht 5\' 3"  (1.6 m)   Wt 108.9 kg   LMP 09/26/2018 (Exact Date)   BMI 42.51 kg/m    Blood pressures: 14/61, 114/61, 125/76, 136/72, 131/85  General: no acute distress.  HEENT: normocephalic, atraumatic Heart: regular rate & rhythm.  No murmurs/rubs/gallops Lungs: clear to auscultation bilaterally Abdomen: soft, gravid, non-tender;   Extremities: non-tender, symmetric, no edema bilaterally.   Neurologic: Alert & oriented x 3.     Baseline FHR: 130-135 baseline with accelerations to 150, moderate variability Toco: irregular, mild contractions   LAB results: BUN=10, cr=0.51, ALT and AST  WNL,  Platelets: 258K; 10.4/32.7% P/C ratio: 200 mgm  Assessment:  Karen Woodward is a 18 y.o. G57P0010 female at [redacted]w[redacted]d with a transient blood pressure elevation Normotensive and normal labs   Discharge home with labor and preeclampsia precautions Follow up next week as scheduled at [facility] and prn  Procedures: none  Discharge Condition: stable  Disposition: Discharge disposition: 01-Home or Self Care       Diet: Regular diet  Discharge Activity: Activity as tolerated  Discharge Instructions    Discharge patient   Complete by: As directed    Discharge disposition: 01-Home or Self Care   Discharge patient date: 06/11/2019     Allergies as of 06/11/2019   No Known Allergies      Medication List    TAKE these medications   PRENATAL GUMMIES/DHA & FA PO Take by mouth.        Total time spent taking care of this patient: 15 minutes  Signed: Dalia Heading, CNM

## 2019-06-11 NOTE — Progress Notes (Signed)
Routine Prenatal Care Visit  Subjective  Karen Woodward is a 18 y.o. G2P0010 at [redacted]w[redacted]d being seen today for ongoing prenatal care.  She is currently monitored for the following issues for this low-risk pregnancy and has Menometrorrhagia; Soft tissue swelling of back; Dysmenorrhea; Encounter for supervision of normal first pregnancy in first trimester; Obesity in pregnancy, antepartum; Abnormal yolk sac; BMI 37.0-37.9, adult; and Labor and delivery, indication for care on their problem list.  ----------------------------------------------------------------------------------- Patient reports headache.   Contractions: Not present. Vag. Bleeding: None.  Movement: Present. Denies leaking of fluid.  ----------------------------------------------------------------------------------- The following portions of the patient's history were reviewed and updated as appropriate: allergies, current medications, past family history, past medical history, past social history, past surgical history and problem list. Problem list updated.   Objective  Blood pressure (!) 144/88, last menstrual period 09/26/2018, unknown if currently breastfeeding. Pregravid weight 214 lb (97.1 kg) Total Weight Gain 20 lb (9.072 kg)  There is no height or weight on file to calculate BMI.  Urinalysis:      Fetal Status: Fetal Heart Rate (bpm): 145 Fundal Height: 38 cm Movement: Present  Presentation: Vertex  General:  Alert, oriented and cooperative. Patient is in no acute distress.  Skin: Skin is warm and dry. No rash noted.   Cardiovascular: Normal heart rate noted  Respiratory: Normal respiratory effort, no problems with respiration noted  Abdomen: Soft, gravid, appropriate for gestational age. Pain/Pressure: Absent     Pelvic:  Cervical exam deferred Dilation: 2.5 Effacement (%): 50 Station: -3  Extremities: Normal range of motion.     ental Status: Normal mood and affect. Normal behavior. Normal judgment and thought  content.    Assessment   18 y.o. G2P0010 at [redacted]w[redacted]d by  07/03/2019, by Last Menstrual Period presenting for routine prenatal visit  Plan   Pregnancy#2 Problems (from 09/26/18 to present)    Problem Noted Resolved   BMI 37.0-37.9, adult 12/18/2018 by Conard Novak, MD No   Obesity in pregnancy, antepartum 11/12/2018 by Natale Milch, MD No   Overview Signed 04/14/2019 10:15 AM by Vena Austria, MD    28 week growth 3lbs 0oz 61.8%ile with AFI 10.4cm      Encounter for supervision of normal first pregnancy in first trimester 11/01/2018 by Vena Austria, MD No   Overview Addendum 05/28/2019  3:04 PM by Nadara Mustard, MD      [facility]inic [facility] Prenatal Labs  Dating  LMP =6wk [place] Blood type: --/--/O NEG (03/15 1840)   Genetic Screen Normal XY Antibody:NEG (03/15 1840)  Anatomic [place] Normal Female Rubella: Immune Varicella: Non-Immune  GTT Early: 98   28 wk: 233     RPR: NR  Rhogam N/A HBsAg: negative  TDaP vaccine 04/28/2019                    HIV: negative  Flu Shot            declines                    GBS:   Contraception unsure Pap: Under 21  CBB  no   CS/VBAC N/A   Baby Food Breast   Support Person                 Gestational age appropriate obstetric precautions including but not limited to vaginal bleeding, contractions, leaking of fluid and fetal movement were reviewed in detail with the patient.    - GBS/aptima today -  to L&D for preeclampsia evaluation  Return in about 5 days (around 06/16/2019) for ROB/BP check.  Malachy Mood, MD, [facility], [facility] 06/11/2019, 3:15 PM

## 2019-06-11 NOTE — OB Triage Note (Signed)
Pt. Presents to L/D from office for elevated pressures. Last B/P 114/61. No current headache. She does describe visual "floaters" that have been ongoing for a few weeks. No other PIH symptoms. No bleeding or LOF. Positive fetal movement. Pt. Stable at this time.

## 2019-06-13 LAB — STREP GP B NAA: Strep Gp B NAA: NEGATIVE

## 2019-06-13 NOTE — L&D Delivery Note (Signed)
Delivery Note At 2:08 PM a viable female was delivered via Vaginal, Spontaneous (Presentation: LOA.  APGAR:8, 9; weight pending.   Placenta status: Spontaneous, Intact.  Cord: 3 vessels with the following complications: postpartum hemorrhage.  Cord pH: N/A  Anesthesia: Epidural Episiotomy: None Lacerations:  2nd degree Suture Repair: 3.0 vicryl Est. Blood Loss (mL):  QBL pending.  No cervical laceration visualized.  Bimanual massage and uterine sweep evacuated significant clot burden.  Patient received cytotec rectally, 1g tranexamic acid, and hemabate  Mom to postpartum.  Baby to Couplet care / Skin to Skin.  Vena Austria 06/20/2019, 2:32 PM

## 2019-06-16 ENCOUNTER — Encounter: Payer: Self-pay | Admitting: Obstetrics and Gynecology

## 2019-06-16 LAB — CERVICOVAGINAL ANCILLARY ONLY
Chlamydia: NEGATIVE
Comment: NEGATIVE
Comment: NORMAL
Neisseria Gonorrhea: NEGATIVE

## 2019-06-18 ENCOUNTER — Ambulatory Visit (INDEPENDENT_AMBULATORY_CARE_PROVIDER_SITE_OTHER): Payer: Managed Care, Other (non HMO) | Admitting: Obstetrics and Gynecology

## 2019-06-18 ENCOUNTER — Other Ambulatory Visit: Payer: Self-pay

## 2019-06-18 VITALS — BP 140/79 | Wt 245.0 lb

## 2019-06-18 DIAGNOSIS — O163 Unspecified maternal hypertension, third trimester: Secondary | ICD-10-CM

## 2019-06-18 DIAGNOSIS — Z3A37 37 weeks gestation of pregnancy: Secondary | ICD-10-CM

## 2019-06-18 DIAGNOSIS — O99210 Obesity complicating pregnancy, unspecified trimester: Secondary | ICD-10-CM

## 2019-06-18 DIAGNOSIS — O99213 Obesity complicating pregnancy, third trimester: Secondary | ICD-10-CM

## 2019-06-18 DIAGNOSIS — Z3403 Encounter for supervision of normal first pregnancy, third trimester: Secondary | ICD-10-CM

## 2019-06-18 LAB — POCT URINALYSIS DIPSTICK OB
Glucose, UA: NEGATIVE
POC,PROTEIN,UA: NEGATIVE

## 2019-06-18 NOTE — Progress Notes (Signed)
Routine Prenatal Care Visit  Subjective  Karen Woodward is a 19 y.o. G2P0010 at [redacted]w[redacted]d being seen today for ongoing prenatal care.  She is currently monitored for the following issues for this low-risk pregnancy and has Menometrorrhagia; Soft tissue swelling of back; Dysmenorrhea; Encounter for supervision of normal first pregnancy in third trimester; Obesity in pregnancy, antepartum; Abnormal yolk sac; BMI 37.0-37.9, adult; Labor and delivery, indication for care; and Elevated blood pressure complicating pregnancy in third trimester, antepartum on their problem list.  ----------------------------------------------------------------------------------- Patient reports no complaints.   Contractions: Irregular. Vag. Bleeding: None.  Movement: Present. Denies leaking of fluid.  ----------------------------------------------------------------------------------- The following portions of the patient's history were reviewed and updated as appropriate: allergies, current medications, past family history, past medical history, past social history, past surgical history and problem list. Problem list updated.   Objective  Blood pressure 140/79, weight 245 lb (111.1 kg), last menstrual period 09/26/2018, unknown if currently breastfeeding. Pregravid weight 214 lb (97.1 kg) Total Weight Gain 31 lb (14.1 kg) Urinalysis:      Fetal Status: Fetal Heart Rate (bpm): 130 Fundal Height: 39 cm Movement: Present  Presentation: Vertex  General:  Alert, oriented and cooperative. Patient is in no acute distress.  Skin: Skin is warm and dry. No rash noted.   Cardiovascular: Normal heart rate noted  Respiratory: Normal respiratory effort, no problems with respiration noted  Abdomen: Soft, gravid, appropriate for gestational age. Pain/Pressure: Absent     Pelvic:  Cervical exam deferred        Extremities: Normal range of motion.     ental Status: Normal mood and affect. Normal behavior. Normal judgment and  thought content.     Assessment   18 y.o. G2P0010 at [redacted]w[redacted]d by  07/03/2019, by Last Menstrual Period presenting for routine prenatal visit  Plan   Pregnancy#2 Problems (from 09/26/18 to present)    Problem Noted Resolved   BMI 37.0-37.9, adult 12/18/2018 by Conard Novak, MD No   Obesity in pregnancy, antepartum 11/12/2018 by Natale Milch, MD No   Overview Signed 04/14/2019 10:15 AM by Vena Austria, MD    28 week growth 3lbs 0oz 61.8%ile with AFI 10.4cm      Encounter for supervision of normal first pregnancy in third trimester 11/01/2018 by Vena Austria, MD No   Overview Addendum 06/16/2019  8:40 AM by Vena Austria, MD      [facility] Prenatal Labs  Dating  LMP =6wk [place] Blood type: --/--/O NEG (03/15 1840)   Genetic Screen Normal XY Antibody:NEG (03/15 1840)  Anatomic [place] Normal Female Rubella: Immune Varicella: Non-Immune  GTT Early: 98   28 wk: 233     RPR: NR  Rhogam N/A HBsAg: negative  TDaP vaccine 04/28/2019                    HIV: negative  Flu Shot            declines                    [id number]  Contraception unsure Pap: Under 21  CBB  no   CS/VBAC N/A   Baby Food Breast   Support Person                 Gestational age appropriate obstetric precautions including but not limited to vaginal bleeding, contractions, leaking of fluid and fetal movement were reviewed in detail with the patient.    systolic still mildly elevated, no  symptoms, no increased edema, no headaches.  Was similar range last week but normotensive on L&D with normal labs  Return in about 5 days (around 06/23/2019) for ROB/BP check.  Malachy Mood, MD, [facility], [facility] 06/18/2019, 2:50 PM

## 2019-06-18 NOTE — Progress Notes (Signed)
ROB- no concerns 

## 2019-06-19 ENCOUNTER — Encounter: Payer: Self-pay | Admitting: Obstetrics and Gynecology

## 2019-06-19 ENCOUNTER — Inpatient Hospital Stay
Admission: EM | Admit: 2019-06-19 | Discharge: 2019-06-22 | DRG: 806 | Disposition: A | Discharge status: Home / Self Care | Payer: Managed Care, Other (non HMO) | Attending: Advanced Practice Midwife | Admitting: Advanced Practice Midwife

## 2019-06-19 ENCOUNTER — Other Ambulatory Visit: Payer: Self-pay

## 2019-06-19 DIAGNOSIS — O9081 Anemia of the puerperium: Secondary | ICD-10-CM | POA: Diagnosis present

## 2019-06-19 DIAGNOSIS — Z20822 Contact with and (suspected) exposure to covid-19: Secondary | ICD-10-CM | POA: Diagnosis present

## 2019-06-19 DIAGNOSIS — Z3A38 38 weeks gestation of pregnancy: Secondary | ICD-10-CM

## 2019-06-19 DIAGNOSIS — O1404 Mild to moderate pre-eclampsia, complicating childbirth: Principal | ICD-10-CM | POA: Diagnosis present

## 2019-06-19 DIAGNOSIS — O99214 Obesity complicating childbirth: Secondary | ICD-10-CM | POA: Diagnosis present

## 2019-06-19 DIAGNOSIS — D62 Acute posthemorrhagic anemia: Secondary | ICD-10-CM | POA: Diagnosis not present

## 2019-06-19 DIAGNOSIS — R030 Elevated blood-pressure reading, without diagnosis of hypertension: Secondary | ICD-10-CM | POA: Diagnosis present

## 2019-06-19 DIAGNOSIS — Z3403 Encounter for supervision of normal first pregnancy, third trimester: Secondary | ICD-10-CM

## 2019-06-19 DIAGNOSIS — D649 Anemia, unspecified: Secondary | ICD-10-CM | POA: Diagnosis not present

## 2019-06-19 DIAGNOSIS — O1493 Unspecified pre-eclampsia, third trimester: Secondary | ICD-10-CM | POA: Diagnosis present

## 2019-06-19 DIAGNOSIS — O99891 Other specified diseases and conditions complicating pregnancy: Secondary | ICD-10-CM

## 2019-06-19 LAB — PROTEIN / CREATININE RATIO, URINE
Creatinine, Urine: 63 mg/dL
Protein Creatinine Ratio: 0.32 mg/mg{Cre} — ABNORMAL HIGH (ref 0.00–0.15)
Total Protein, Urine: 20 mg/dL

## 2019-06-19 LAB — COMPREHENSIVE METABOLIC PANEL
ALT: 14 U/L (ref 0–44)
AST: 17 U/L (ref 15–41)
Albumin: 2.9 g/dL — ABNORMAL LOW (ref 3.5–5.0)
Alkaline Phosphatase: 177 U/L — ABNORMAL HIGH (ref 38–126)
Anion gap: 11 (ref 5–15)
BUN: 8 mg/dL (ref 6–20)
CO2: 19 mmol/L — ABNORMAL LOW (ref 22–32)
Calcium: 9.2 mg/dL (ref 8.9–10.3)
Chloride: 107 mmol/L (ref 98–111)
Creatinine, Ser: 0.56 mg/dL (ref 0.44–1.00)
GFR calc Af Amer: >60 mL/min (ref >60)
GFR calc non Af Amer: >60 mL/min (ref >60)
Glucose, Bld: 99 mg/dL (ref 70–99)
Potassium: 3.7 mmol/L (ref 3.5–5.1)
Sodium: 137 mmol/L (ref 135–145)
Total Bilirubin: 0.3 mg/dL (ref 0.3–1.2)
Total Protein: 6.8 g/dL (ref 6.5–8.1)

## 2019-06-19 LAB — CBC
HCT: 35 % — ABNORMAL LOW (ref 36.0–46.0)
Hemoglobin: 11.3 g/dL — ABNORMAL LOW (ref 12.0–15.0)
MCH: 26 pg (ref 26.0–34.0)
MCHC: 32.3 g/dL (ref 30.0–36.0)
MCV: 80.5 fL (ref 80.0–100.0)
Platelets: 271 10*3/uL (ref 150–400)
RBC: 4.35 MIL/uL (ref 3.87–5.11)
RDW: 14.6 % (ref 11.5–15.5)
WBC: 11.4 10*3/uL — ABNORMAL HIGH (ref 4.0–10.5)
nRBC: 0 % (ref 0.0–0.2)

## 2019-06-19 LAB — RESPIRATORY PANEL BY RT PCR (FLU A&B, COVID)
Influenza A by PCR: NEGATIVE
Influenza B by PCR: NEGATIVE
SARS Coronavirus 2 by RT PCR: NEGATIVE

## 2019-06-19 LAB — RAPID HIV SCREEN (HIV 1/2 AB+AG)
HIV 1/2 Antibodies: NONREACTIVE
HIV-1 P24 Antigen - HIV24: NONREACTIVE

## 2019-06-19 LAB — OB RESULTS CONSOLE HIV ANTIBODY (ROUTINE TESTING): HIV: NONREACTIVE

## 2019-06-19 MED ORDER — MISOPROSTOL 25 MCG QUARTER TABLET
25.0000 ug | ORAL_TABLET | ORAL | Status: DC | PRN
  Administered 2019-06-19 (×2): 25 ug via VAGINAL
  Filled 2019-06-19 (×2): qty 1

## 2019-06-19 MED ORDER — MISOPROSTOL 200 MCG PO TABS
ORAL_TABLET | ORAL | Status: AC
  Filled 2019-06-19: qty 4

## 2019-06-19 MED ORDER — LIDOCAINE HCL (PF) 1 % IJ SOLN
INTRAMUSCULAR | Status: AC
  Filled 2019-06-19: qty 30

## 2019-06-19 MED ORDER — OXYTOCIN BOLUS FROM INFUSION
500.0000 mL | Freq: Once | INTRAVENOUS | Status: AC
  Administered 2019-06-20: 14:00:00 500 mL via INTRAVENOUS

## 2019-06-19 MED ORDER — LACTATED RINGERS IV SOLN: 500.0000 mL | INTRAVENOUS | Status: DC | PRN

## 2019-06-19 MED ORDER — BUTORPHANOL TARTRATE 1 MG/ML IJ SOLN: 1.0000 mg | INTRAMUSCULAR | Status: DC | PRN

## 2019-06-19 MED ORDER — OXYTOCIN 40 UNITS IN NORMAL SALINE INFUSION - SIMPLE MED: 2.5000 [IU]/h | INTRAVENOUS | Status: DC

## 2019-06-19 MED ORDER — OXYTOCIN 40 UNITS IN NORMAL SALINE INFUSION - SIMPLE MED
1.0000 m[IU]/min | INTRAVENOUS | Status: DC
  Administered 2019-06-20: 04:00:00 2 m[IU]/min via INTRAVENOUS
  Filled 2019-06-19: qty 1000

## 2019-06-19 MED ORDER — LACTATED RINGERS IV SOLN: INTRAVENOUS | Status: DC

## 2019-06-19 MED ORDER — TERBUTALINE SULFATE 1 MG/ML IJ SOLN: 0.2500 mg | Freq: Once | INTRAMUSCULAR | Status: DC | PRN

## 2019-06-19 MED ORDER — LIDOCAINE HCL (PF) 1 % IJ SOLN
30.0000 mL | INTRAMUSCULAR | Status: AC | PRN
  Administered 2019-06-20: 1.2 mL via SUBCUTANEOUS

## 2019-06-19 MED ORDER — ONDANSETRON HCL 4 MG/2ML IJ SOLN: 4.0000 mg | Freq: Four times a day (QID) | INTRAMUSCULAR | Status: DC | PRN

## 2019-06-19 MED ORDER — SOD CITRATE-CITRIC ACID 500-334 MG/5ML PO SOLN: 30.0000 mL | ORAL | Status: DC | PRN

## 2019-06-19 MED ORDER — MISOPROSTOL 25 MCG QUARTER TABLET: 25.0000 ug | ORAL_TABLET | ORAL | Status: DC

## 2019-06-19 MED ORDER — OXYTOCIN 10 UNIT/ML IJ SOLN
INTRAMUSCULAR | Status: AC
  Filled 2019-06-19: qty 2

## 2019-06-19 MED ORDER — AMMONIA AROMATIC IN INHA
RESPIRATORY_TRACT | Status: AC
  Filled 2019-06-19: qty 10

## 2019-06-19 NOTE — OB Triage Note (Signed)
Patient arrived by herself for evaluation of bp as she has been monitoring them at home and reports bp's 130s-180s/90s-108 at home today. Patient reports no visual disturbances, no epigastric pain, and no headaches at this time. She stated that she did have headaches and spots in her vision a little over a week ago but nothing since. I will get patient on the EFM and continue to assess. Patient reports no vaginal bleeding or leaking of fluids and no contractions.

## 2019-06-19 NOTE — Plan of Care (Signed)
Patient educated on labor and some things to expect. Patient calm and cooperative and verbalizes understanding. Patient will be started on induction of labor as soon as possible to move to labor room

## 2019-06-19 NOTE — Progress Notes (Signed)
   06/19/19 1545  Provider Notification  Provider Name/Title C. Gaynelle Arabian, MD  Method of Notification In department  Request Evaluate in person  Notification Reason Other (Comment) (triage report given to Dr. Jerene Pitch)  Reviewed patient case with Dr. Jerene Pitch including current s&s and bp's since arrival. New orders received. See orders.

## 2019-06-19 NOTE — Telephone Encounter (Signed)
Please advise 

## 2019-06-19 NOTE — H&P (Signed)
H&P  Karen Woodward is an 19 y.o. female.  HPI: Patient presents to L&D with complaints of elevated BP at home.  She reports headache and flashing visual changes last week.  She is feeling well now without severe headache.  She is feeling baby move she denies any leakage of fluid she denies any vaginal bleeding.  She is having mild Braxton Hicks contractions.  Pregnancy#2 Problems (from 09/26/18 to present)    Problem Noted Resolved   BMI 37.0-37.9, adult 12/18/2018 by Will Bonnet, MD No   Obesity in pregnancy, antepartum 11/12/2018 by Homero Fellers, MD No   Overview Signed 04/14/2019 10:15 AM by Malachy Mood, MD    28 week growth 3lbs 0oz 61.8%ile with AFI 10.4cm      Encounter for supervision of normal first pregnancy in third trimester 11/01/2018 by Malachy Mood, MD No   Overview Addendum 06/16/2019  8:40 AM by Malachy Mood, MD      [facility]inic [facility] Prenatal Labs  Dating  LMP =6wk [place] Blood type: --/--/O NEG (03/15 1840)   Genetic Screen Normal XY Antibody:NEG (03/15 1840)  Anatomic [place] Normal Female Rubella: Immune Varicella: Non-Immune  GTT Early: 98   28 wk: 233     RPR: NR  Rhogam N/A HBsAg: negative  TDaP vaccine 04/28/2019                    HIV: negative  Flu Shot            declines                    [id number]  Contraception unsure Pap: Under 21  CBB  no   CS/VBAC N/A   Baby Food Breast   Support Person                 Past Medical History:  Diagnosis Date  . Anemia   . Morbid obesity with BMI of 40.0-44.9, adult [facility])     Past Surgical History:  Procedure Laterality Date  . TONSILLECTOMY AND ADENOIDECTOMY  06/2014    Family History  Problem Relation Age of Onset  . Hypertension Mother   . Diabetes Maternal Grandfather     Social History:  reports that she has never smoked. She has never used smokeless tobacco. She reports that she does not drink alcohol or use drugs.  Allergies: No Known Allergies  Medications: I have  reviewed the patient's current medications.  Results for orders placed or performed in visit on 06/18/19 (from the past 48 hour(s))  POC Urinalysis Dipstick OB     Status: Normal   Collection Time: 06/18/19  2:11 PM  Result Value Ref Range   Color, UA     Clarity, UA     Glucose, UA Negative Negative   Bilirubin, UA     Ketones, UA     Spec Grav, UA     Blood, UA     pH, UA     POC,PROTEIN,UA Negative Negative, Trace, Small (1+), Moderate (2+), Large (3+), 4+   Urobilinogen, UA     Nitrite, UA     Leukocytes, UA     Appearance     Odor      No results found.  Review of Systems  Constitutional: Negative for chills and fever.  HENT: Negative for congestion, hearing loss and sinus pain.   Respiratory: Negative for cough, shortness of breath and wheezing.   Cardiovascular: Negative for chest pain, palpitations and leg  swelling.  Gastrointestinal: Negative for abdominal pain, constipation, diarrhea, nausea and vomiting.  Genitourinary: Negative for dysuria, flank pain, frequency, hematuria and urgency.  Musculoskeletal: Negative for back pain.  Skin: Negative for rash.  Neurological: Negative for dizziness and headaches.  Psychiatric/Behavioral: Negative for suicidal ideas. The patient is not nervous/anxious.    Blood pressure 136/87, pulse 98, temperature 98.5 F (36.9 C), temperature source Oral, resp. rate 16, height 5\' 3"  (1.6 m), weight 111.1 kg, last menstrual period 09/26/2018, unknown if currently breastfeeding.   Physical Exam  Nursing note and vitals reviewed. Constitutional: She is oriented to person, place, and time. She appears well-developed and well-nourished.  HENT:  Head: Normocephalic and atraumatic.  Cardiovascular: Normal rate and regular rhythm.  Respiratory: Effort normal and breath sounds normal.  GI: Soft. Bowel sounds are normal.  Musculoskeletal:        General: Normal range of motion.  Neurological: She is alert and oriented to person, place, and  time.  Skin: Skin is warm and dry.  Psychiatric: She has a normal mood and affect. Her behavior is normal. Judgment and thought content normal.   NST: 125 bpm baseline, moderate variability, 15x15 accelerations, no decelerations. Tocometer : quiet  Assessment/Plan: 19 yo G2P0010 [redacted]w[redacted]d 1.  Preeclampsia with moderate range blood pressures. Moderate range blood pressure. Will start magnesium if needed.  2. IOL- will start with cytotec. 3. GBS negative 4. Epidural and Stadol PRN  Briggs Edelen R Laiken Sandy 06/19/2019, 4:26 PM

## 2019-06-19 NOTE — Telephone Encounter (Signed)
Needs to go go L&D

## 2019-06-20 ENCOUNTER — Encounter: Payer: Self-pay | Admitting: Obstetrics and Gynecology

## 2019-06-20 ENCOUNTER — Inpatient Hospital Stay: Payer: Managed Care, Other (non HMO) | Admitting: Anesthesiology

## 2019-06-20 LAB — FIBRINOGEN: Fibrinogen: 584 mg/dL — ABNORMAL HIGH (ref 210–475)

## 2019-06-20 LAB — PROTIME-INR
INR: 1 (ref 0.8–1.2)
Prothrombin Time: 13 seconds (ref 11.4–15.2)

## 2019-06-20 LAB — CBC
HCT: 27 % — ABNORMAL LOW (ref 36.0–46.0)
Hemoglobin: 8.6 g/dL — ABNORMAL LOW (ref 12.0–15.0)
MCH: 25.8 pg — ABNORMAL LOW (ref 26.0–34.0)
MCHC: 31.9 g/dL (ref 30.0–36.0)
MCV: 81.1 fL (ref 80.0–100.0)
Platelets: 246 10*3/uL (ref 150–400)
RBC: 3.33 MIL/uL — ABNORMAL LOW (ref 3.87–5.11)
RDW: 14.7 % (ref 11.5–15.5)
WBC: 19.9 10*3/uL — ABNORMAL HIGH (ref 4.0–10.5)
nRBC: 0 % (ref 0.0–0.2)

## 2019-06-20 LAB — RPR: RPR Ser Ql: NONREACTIVE

## 2019-06-20 LAB — APTT: aPTT: 24 seconds (ref 24–36)

## 2019-06-20 LAB — PREPARE RBC (CROSSMATCH)

## 2019-06-20 MED ORDER — DIPHENHYDRAMINE HCL 25 MG PO CAPS: 25.0000 mg | ORAL_CAPSULE | Freq: Four times a day (QID) | ORAL | Status: DC | PRN

## 2019-06-20 MED ORDER — TRANEXAMIC ACID-NACL 1000-0.7 MG/100ML-% IV SOLN
1000.0000 mg | INTRAVENOUS | Status: AC
  Administered 2019-06-20: 15:00:00 1000 mg via INTRAVENOUS
  Filled 2019-06-20: qty 100

## 2019-06-20 MED ORDER — METHYLERGONOVINE MALEATE 0.2 MG/ML IJ SOLN
INTRAMUSCULAR | Status: AC
  Filled 2019-06-20: qty 1

## 2019-06-20 MED ORDER — OXYCODONE-ACETAMINOPHEN 5-325 MG PO TABS: 2.0000 | ORAL_TABLET | ORAL | Status: DC | PRN

## 2019-06-20 MED ORDER — WITCH HAZEL-GLYCERIN EX PADS
1.0000 "application " | MEDICATED_PAD | CUTANEOUS | Status: DC | PRN
  Filled 2019-06-20 (×2): qty 100

## 2019-06-20 MED ORDER — PRENATAL MULTIVITAMIN CH
1.0000 | ORAL_TABLET | Freq: Every day | ORAL | Status: DC
  Administered 2019-06-21: 1 via ORAL
  Filled 2019-06-20: qty 1

## 2019-06-20 MED ORDER — LACTATED RINGERS IV SOLN: 500.0000 mL | Freq: Once | INTRAVENOUS | Status: DC

## 2019-06-20 MED ORDER — OXYCODONE-ACETAMINOPHEN 5-325 MG PO TABS: 1.0000 | ORAL_TABLET | ORAL | Status: DC | PRN

## 2019-06-20 MED ORDER — SENNOSIDES-DOCUSATE SODIUM 8.6-50 MG PO TABS
2.0000 | ORAL_TABLET | ORAL | Status: DC
  Administered 2019-06-21: 2 via ORAL
  Filled 2019-06-20: qty 2

## 2019-06-20 MED ORDER — BENZOCAINE-MENTHOL 20-0.5 % EX AERO
1.0000 "application " | INHALATION_SPRAY | CUTANEOUS | Status: DC | PRN
  Filled 2019-06-20 (×2): qty 56

## 2019-06-20 MED ORDER — FENTANYL 2.5 MCG/ML W/ROPIVACAINE 0.15% IN NS 100 ML EPIDURAL (ARMC)
EPIDURAL | Status: DC | PRN
  Administered 2019-06-20: 12 mL/h via EPIDURAL

## 2019-06-20 MED ORDER — FENTANYL 2.5 MCG/ML W/ROPIVACAINE 0.15% IN NS 100 ML EPIDURAL (ARMC)
EPIDURAL | Status: AC
  Filled 2019-06-20: qty 100

## 2019-06-20 MED ORDER — IBUPROFEN 600 MG PO TABS
600.0000 mg | ORAL_TABLET | Freq: Four times a day (QID) | ORAL | Status: DC
  Administered 2019-06-21 – 2019-06-22 (×4): 600 mg via ORAL
  Filled 2019-06-20 (×5): qty 1

## 2019-06-20 MED ORDER — MISOPROSTOL 200 MCG PO TABS
800.0000 ug | ORAL_TABLET | Freq: Once | ORAL | Status: AC
  Administered 2019-06-20: 800 ug via RECTAL

## 2019-06-20 MED ORDER — PHENYLEPHRINE 40 MCG/ML (10ML) SYRINGE FOR IV PUSH (FOR BLOOD PRESSURE SUPPORT)
80.0000 ug | PREFILLED_SYRINGE | INTRAVENOUS | Status: DC | PRN
  Filled 2019-06-20: qty 10

## 2019-06-20 MED ORDER — COCONUT OIL OIL: 1.0000 "application " | TOPICAL_OIL | Status: DC | PRN

## 2019-06-20 MED ORDER — ONDANSETRON HCL 4 MG PO TABS: 4.0000 mg | ORAL_TABLET | ORAL | Status: DC | PRN

## 2019-06-20 MED ORDER — DIPHENHYDRAMINE HCL 50 MG/ML IJ SOLN: 12.5000 mg | INTRAMUSCULAR | Status: DC | PRN

## 2019-06-20 MED ORDER — FENTANYL 2.5 MCG/ML W/ROPIVACAINE 0.15% IN NS 100 ML EPIDURAL (ARMC)
12.0000 mL/h | EPIDURAL | Status: DC
  Administered 2019-06-20: 14:00:00 12 mL/h via EPIDURAL
  Filled 2019-06-20: qty 100

## 2019-06-20 MED ORDER — ACETAMINOPHEN 325 MG PO TABS
650.0000 mg | ORAL_TABLET | ORAL | Status: DC | PRN
  Administered 2019-06-21 – 2019-06-22 (×4): 650 mg via ORAL
  Filled 2019-06-20 (×4): qty 2

## 2019-06-20 MED ORDER — CARBOPROST TROMETHAMINE 250 MCG/ML IM SOLN: 250.0000 ug | Freq: Once | INTRAMUSCULAR | Status: AC

## 2019-06-20 MED ORDER — EPHEDRINE 5 MG/ML INJ
10.0000 mg | INTRAVENOUS | Status: DC | PRN
  Filled 2019-06-20: qty 2

## 2019-06-20 MED ORDER — DIBUCAINE (PERIANAL) 1 % EX OINT
1.0000 "application " | TOPICAL_OINTMENT | CUTANEOUS | Status: DC | PRN
  Filled 2019-06-20 (×2): qty 28

## 2019-06-20 MED ORDER — ONDANSETRON HCL 4 MG/2ML IJ SOLN: 4.0000 mg | INTRAMUSCULAR | Status: DC | PRN

## 2019-06-20 MED ORDER — AMMONIA AROMATIC IN INHA
RESPIRATORY_TRACT | Status: AC
  Filled 2019-06-20: qty 10

## 2019-06-20 MED ORDER — CARBOPROST TROMETHAMINE 250 MCG/ML IM SOLN
INTRAMUSCULAR | Status: AC
  Administered 2019-06-20: 14:00:00 250 ug via INTRAMUSCULAR
  Filled 2019-06-20: qty 1

## 2019-06-20 MED ORDER — LIDOCAINE-EPINEPHRINE (PF) 1.5 %-1:200000 IJ SOLN
INTRAMUSCULAR | Status: DC | PRN
  Administered 2019-06-20: 3 mL via EPIDURAL

## 2019-06-20 MED ORDER — SIMETHICONE 80 MG PO CHEW: 80.0000 mg | CHEWABLE_TABLET | ORAL | Status: DC | PRN

## 2019-06-20 NOTE — Progress Notes (Signed)
  Labor Progress Note   19 y.o. G2P0010 @ [redacted]w[redacted]d , admitted for  Pregnancy, Labor Management.   Subjective:  Feeling continuous pressure. Pain is controlled with epidural.  Objective:  BP 122/70 (BP Location: Right Arm)   Pulse 84   Temp 97.8 F (36.6 C) (Axillary)   Resp 17   Ht 5\' 3"  (1.6 m)   Wt 111.1 kg   LMP 09/26/2018 (Exact Date)   BMI 43.40 kg/m  Abd: gravid, ND, FHT present, mild tenderness on exam Extr: trace to 1+ bilateral pedal edema SVE: CERVIX: 9.5 cm dilated (mainly on right), 100 effaced, +2 station  EFM: FHR: 115 bpm, variability: moderate,  accelerations:  Present,  decelerations:  Absent Toco: Frequency: Every 1.5-3 minutes Labs: I have reviewed the patient's lab results.   Assessment & Plan:  G2P0010 @ [redacted]w[redacted]d, admitted for  Pregnancy and Labor/Delivery Management  1. Pain management: epidural. 2. FWB: FHT category I.  3. ID: GBS negative 4. Labor management: labor down  All discussed with patient, see orders   [redacted]w[redacted]d, CNM [facility] 06/20/2019  10:15 AM

## 2019-06-20 NOTE — Anesthesia Procedure Notes (Signed)
Epidural Patient location during procedure: OB Start time: 06/20/2019 5:47 AM End time: 06/20/2019 6:10 AM  Staffing Performed: anesthesiologist   Preanesthetic Checklist Completed: patient identified, IV checked, site marked, risks and benefits discussed, surgical consent, monitors and equipment checked, pre-op evaluation and timeout performed  Epidural Patient position: sitting Prep: Betadine Patient monitoring: heart rate, continuous pulse ox and blood pressure Approach: midline Location: L4-L5 Injection technique: LOR saline  Needle:  Needle type: Tuohy  Needle gauge: 17 G Needle length: 9 cm and 9 Needle insertion depth: 9 cm Catheter type: closed end flexible Catheter size: 19 Gauge Catheter at skin depth: 15 cm Test dose: negative and 1.5% lidocaine with Epi 1:200 K  Assessment Events: blood not aspirated, injection not painful, no injection resistance, no paresthesia and negative IV test  Additional Notes   Patient tolerated the insertion well without complications.Reason for block:procedure for pain

## 2019-06-20 NOTE — Progress Notes (Signed)
SVE 3.5/70/-1 AROM -clear Continue with pitocin IOL  Adelene Idler MD [facility], [facility] 06/20/2019 6:20 AM

## 2019-06-20 NOTE — Anesthesia Preprocedure Evaluation (Signed)
Anesthesia Evaluation  Patient identified by MRN, date of birth, ID band Patient awake    Reviewed: Allergy & Precautions, NPO status , Patient's Chart, lab work & pertinent test results  History of Anesthesia Complications Negative for: history of anesthetic complications  Airway Mallampati: III       Dental   Pulmonary neg sleep apnea, neg COPD, Not current smoker,           Cardiovascular hypertension (PIH), (-) Past MI and (-) DVT (-) dysrhythmias (-) Valvular Problems/Murmurs     Neuro/Psych neg Seizures    GI/Hepatic Neg liver ROS, GERD (with pregnancy)  ,  Endo/Other  neg diabetes  Renal/GU negative Renal ROS     Musculoskeletal   Abdominal   Peds  Hematology  (+) anemia ,   Anesthesia Other Findings   Reproductive/Obstetrics                             Anesthesia Physical Anesthesia Plan  ASA: III  Anesthesia Plan: Epidural   Post-op Pain Management:    Induction:   PONV Risk Score and Plan:   Airway Management Planned:   Additional Equipment:   Intra-op Plan:   Post-operative Plan:   Informed Consent: I have reviewed the patients History and Physical, chart, labs and discussed the procedure including the risks, benefits and alternatives for the proposed anesthesia with the patient or authorized representative who has indicated his/her understanding and acceptance.       Plan Discussed with:   Anesthesia Plan Comments:         Anesthesia Quick Evaluation

## 2019-06-21 DIAGNOSIS — D649 Anemia, unspecified: Secondary | ICD-10-CM | POA: Diagnosis not present

## 2019-06-21 LAB — CBC
HCT: 19.9 % — ABNORMAL LOW (ref 36.0–46.0)
Hemoglobin: 6.5 g/dL — ABNORMAL LOW (ref 12.0–15.0)
MCH: 26.6 pg (ref 26.0–34.0)
MCHC: 32.7 g/dL (ref 30.0–36.0)
MCV: 81.6 fL (ref 80.0–100.0)
Platelets: 182 10*3/uL (ref 150–400)
RBC: 2.44 MIL/uL — ABNORMAL LOW (ref 3.87–5.11)
RDW: 14.6 % (ref 11.5–15.5)
WBC: 14.4 10*3/uL — ABNORMAL HIGH (ref 4.0–10.5)
nRBC: 0 % (ref 0.0–0.2)

## 2019-06-21 LAB — PREPARE RBC (CROSSMATCH)

## 2019-06-21 MED ORDER — SODIUM CHLORIDE 0.9% IV SOLUTION: Freq: Once | INTRAVENOUS | Status: DC

## 2019-06-21 NOTE — Progress Notes (Signed)
Obstetric Postpartum Daily Progress Note Subjective:  19 y.o. G2P1011 postpartum day #1 status post vaginal delivery.  She is ambulating, is tolerating po, is voiding spontaneously.  Her pain is well controlled on PO pain medications. Her lochia is less than menses.  She reports feeling weak, dizzy, and she has a headache.  All this especially with ambulation.   Medications SCHEDULED MEDICATIONS  . ibuprofen  600 mg Oral Q6H  . prenatal multivitamin  1 tablet Oral Q1200  . senna-docusate  2 tablet Oral Q24H    MEDICATION INFUSIONS    PRN MEDICATIONS  acetaminophen, benzocaine-Menthol, coconut oil, witch hazel-glycerin **AND** dibucaine, diphenhydrAMINE, ondansetron **OR** ondansetron (ZOFRAN) IV, oxyCODONE-acetaminophen, oxyCODONE-acetaminophen, simethicone    Objective:   Vitals:   06/20/19 1925 06/20/19 2317 06/21/19 0540 06/21/19 0832  BP: 135/76 129/80 122/66 128/69  Pulse: 97 (!) 101 (!) 116 89  Resp: 16 15 16 20   Temp: 98.7 F (37.1 C) 97.6 F (36.4 C) 98.2 F (36.8 C) 98.4 F (36.9 C)  TempSrc: Oral Oral Oral Oral  SpO2: 99% 100% 98% 100%  Weight:      Height:        Current Vital Signs 24h Vital Sign Ranges  T 98.4 F (36.9 C) Temp  Avg: 98.2 F (36.8 C)  Min: 97.6 F (36.4 C)  Max: 98.7 F (37.1 C)  BP 128/69 BP  Min: 112/86  Max: 140/83  HR 89 Pulse  Avg: 99.6  Min: 80  Max: 123  RR 20 Resp  Avg: 16.7  Min: 15  Max: 20  SaO2 100 % Room Air SpO2  Avg: 99 %  Min: 98 %  Max: 100 %       24 Hour I/O Current Shift I/O  Time Ins Outs 01/08 0701 - 01/09 0700 In: 3077.5 [I.V.:2817.1] Out: 3430 [Urine:2150] 01/09 0701 - 01/09 1900 In: -  Out: 625 [Urine:625]  General: NAD Pulmonary: no increased work of breathing Abdomen: non-distended, non-tender, fundus firm at level of umbilicus Extremities: no edema, no erythema, no tenderness  Labs:  Recent Labs  Lab 06/19/19 1645 06/20/19 1547 06/21/19 0609  WBC 11.4* 19.9* 14.4*  HGB 11.3* 8.6* 6.5*  HCT 35.0*  27.0* 19.9*  PLT 271 246 182     Assessment:   18 y.o. G2P1011 postpartum day # 1 status post SVD with postpartum hemorrhage resulting in severe anemia.   Plan:   1) Acute blood loss anemia - hemodynamically stable and patient does have symptoms.  - Discussed her vital signs (tachycardia) and symptoms. Discussion held regarding blood transfusion or simply treating with PO iron.  I personally consented the patient for a blood transfusion. Given her current level and risk for postpartum hemorrhage to continue, will give 2 units of pRBCs today.  Patient voiced understanding of my consent and agrees to proceed.  - po ferrous sulfate  2) O NEG /  Information for the patient's newborn:  Illyana, Schorsch Faylene Kurtz  A NEG   / Rubella 6.05 (06/02 0933)/ Varicella Not immune (Varivax - 2 doses, one prior to discharge and one at 6  Weeks)  3) TDAP status received 04/28/2019  4) breast and formula /Contraception = unsure  5) Disposition: likely home tomorrow pending clinical improvement.  04/30/2019, MD 06/21/2019 11:02 AM

## 2019-06-21 NOTE — Progress Notes (Signed)
Pt called RN to room, anxious, tearful. On phone with her parents who also voiced concerns about pt's status. Pt feels dizzy when getting up, headache 5/10, feels very tired and reports she has had little to no sleep. RN provided emotional support and discussed current plan of care and safety measures with pt and family. Paged MD to discuss plan of care, probable blood transfusion. Pt is agreeable to this, stated she felt better after speaking with nursing staff. Family expressed they are anxious because they cannot be here, but plan to help out once they are home. Dr. Jean Rosenthal rounding, notified of pt concerns, will place orders shortly.

## 2019-06-22 DIAGNOSIS — O99891 Other specified diseases and conditions complicating pregnancy: Secondary | ICD-10-CM

## 2019-06-22 LAB — TYPE AND SCREEN
ABO/RH(D): O NEG
Antibody Screen: NEGATIVE
Unit division: 0
Unit division: 0

## 2019-06-22 LAB — BPAM RBC
Blood Product Expiration Date: 202101122359
Blood Product Expiration Date: 202101142359
ISSUE DATE / TIME: 202101091212
ISSUE DATE / TIME: 202101091824
Unit Type and Rh: 9500
Unit Type and Rh: 9500

## 2019-06-22 LAB — HEMOGLOBIN AND HEMATOCRIT, BLOOD
HCT: 28.1 % — ABNORMAL LOW (ref 36.0–46.0)
Hemoglobin: 9.3 g/dL — ABNORMAL LOW (ref 12.0–15.0)

## 2019-06-22 MED ORDER — ACETAMINOPHEN 325 MG PO TABS: 650.0000 mg | ORAL_TABLET | ORAL | Status: DC | PRN

## 2019-06-22 MED ORDER — VARICELLA VIRUS VACCINE LIVE 1350 PFU/0.5ML IJ SUSR
0.5000 mL | INTRAMUSCULAR | Status: DC | PRN
  Filled 2019-06-22: qty 0.5

## 2019-06-22 MED ORDER — IBUPROFEN 600 MG PO TABS: 600.0000 mg | ORAL_TABLET | Freq: Four times a day (QID) | ORAL | 0 refills | Status: DC

## 2019-06-22 NOTE — Plan of Care (Signed)
Vs stable; up ad lib; was able to shower this shift; blood transfusion complete this shift; tolerating regular diet; taking motrin and tylenol occasionally for pain

## 2019-06-22 NOTE — Progress Notes (Signed)
Reviewed D/C instructions with pt and family. Pt verbalized understanding of teaching. Discharged to home via W/C. Pt to schedule f/u appt.  

## 2019-06-22 NOTE — Discharge Summary (Signed)
OB Discharge Summary     Patient Name: Karen Woodward DOB: [birth year] MRN: [id number]  Date of admission: 06/19/2019 Delivering MD: Vena Austria, MD  Date of Delivery: 06/20/2019  Date of discharge: 06/22/2019  Admitting diagnosis: [redacted] weeks gestation of pregnancy [Z3A.38] Preeclampsia, third trimester [O14.93] Intrauterine pregnancy: [redacted]w[redacted]d     Secondary diagnosis: postpartum hemorrhage, severe anemia secondary to postpartum hemorrhage     Discharge diagnosis: Term Pregnancy Delivered, Preeclampsia (mild), Anemia and Postpartum hemorrhage                                                                                                Post partum procedures:blood transfusion 2 units packed red blood cells on postpartum day #1  Augmentation: AROM, Pitocin and Cytotec  Complications: Hemorrhage>1042mL   Hospital course:  Induction of Labor With Vaginal Delivery   19 y.o. yo G2P1011 at [redacted]w[redacted]d was admitted to the hospital 06/19/2019 for induction of labor.  Indication for induction: Preeclampsia.  Patient had an uncomplicated labor course as follows: Membrane Rupture Time/Date: 6:14 AM ,06/20/2019   Intrapartum Procedures: Episiotomy: None [1]                                         Lacerations:  2nd degree [3]  Patient had delivery of a Viable infant.  Information for the patient's newborn:  Imara, Standiford [[id number]]  Delivery Method: Vag-Spont    06/20/2019  Details of delivery can be found in separate delivery note.  Due to postpartum hemorrhage, patient had a hemoglobin on postpartum day 1 of 6.5.  She received 2 units packed red blood cells. On postpartum day #2 her hemoglobin was 9.3 and her symptoms were resolved.  Patient is discharged home 06/22/19.  Physical exam  Vitals:   06/21/19 2136 06/21/19 2326 06/22/19 0353 06/22/19 0815  BP: 135/80 (!) 144/98 132/90 120/70  Pulse: (!) 109 (!) 110 (!) 101 95  Resp: 18 18 18 18   Temp: (!) 97.5 F (36.4 C) (!) 97.4 F (36.3 C)  98.8  F (37.1 C)  TempSrc: Oral Oral  Oral  SpO2: 98% 98% 97% 97%  Weight:      Height:       General: alert, cooperative and no distress Lochia: appropriate Uterine Fundus: firm Incision: N/A DVT Evaluation: No evidence of DVT seen on physical exam. No cords or calf tenderness. No significant calf/ankle edema.  Labs: Lab Results  Component Value Date   WBC 14.4 (H) 06/21/2019   HGB 9.3 (L) 06/22/2019   HCT 28.1 (L) 06/22/2019   MCV 81.6 06/21/2019   PLT 182 06/21/2019    Discharge instruction: per After Visit Summary.  Medications:  Allergies as of 06/22/2019   No Known Allergies     Medication List    TAKE these medications   acetaminophen 325 MG tablet Commonly known as: Tylenol Take 2 tablets (650 mg total) by mouth every 4 (four) hours as needed (for pain scale < 4).   ibuprofen 600 MG tablet Commonly known as:  ADVIL Take 1 tablet (600 mg total) by mouth every 6 (six) hours.   PRENATAL GUMMIES/DHA & FA PO Take by mouth.            Discharge Care Instructions  (From admission, onward)         Start     Ordered   06/22/19 0000  Discharge wound care:    Comments: Perform wound care instructions   06/22/19 0929          Diet: routine diet  Activity: Advance as tolerated. Pelvic rest for 6 weeks.   Outpatient follow up: Follow-up Information    Malachy Mood, MD. Schedule an appointment as soon as possible for a visit in 6 week(s).   Specialty: Obstetrics and Gynecology Why: postpartum appointment Contact information: [address] [place] [address] [phone]             Postpartum contraception: Undecided Rhogam Given postpartum: no Rubella vaccine given postpartum: no Varicella vaccine given postpartum: yes TDaP given antepartum or postpartum: 04/28/2019 Influenza vaccine: Declined  Newborn Data: Live born female  Birth Weight: 7 lb 10.4 oz (3470 g) APGAR: 8, 9  Newborn Delivery   Birth date/time: 06/20/2019  14:08:00 Delivery type: Vaginal, Spontaneous       Baby Feeding: Bottle and Breast  Disposition:home with mother  SIGNED: Prentice Docker, MD 06/22/2019 9:37 AM

## 2019-06-23 ENCOUNTER — Encounter: Payer: Managed Care, Other (non HMO) | Admitting: Obstetrics and Gynecology

## 2019-06-23 NOTE — Anesthesia Postprocedure Evaluation (Addendum)
Anesthesia Post Note  Patient: Karen Woodward  Procedure(s) Performed: AN AD HOC LABOR EPIDURAL  Patient location during evaluation: Mother Baby Anesthesia Type: Epidural Level of consciousness: awake and alert and oriented Pain management: pain level controlled Vital Signs Assessment: post-procedure vital signs reviewed and stable Respiratory status: spontaneous breathing Cardiovascular status: blood pressure returned to baseline Anesthetic complications: no     Last Vitals: There were no vitals filed for this visit.  Last Pain: There were no vitals filed for this visit.       Patient discharged prior to anesthetic visit.  No apparent anesthetic issues per nursing staff.        [name]

## 2019-06-23 NOTE — Addendum Note (Signed)
Addendum  created 06/23/19 1302 by Yves Dill, MD   Clinical Note Signed

## 2019-06-30 ENCOUNTER — Other Ambulatory Visit: Payer: Self-pay | Admitting: Obstetrics and Gynecology

## 2019-06-30 MED ORDER — LIDOCAINE-PRILOCAINE 2.5-2.5 % EX CREA: 1.0000 "application " | TOPICAL_CREAM | CUTANEOUS | 0 refills | Status: DC | PRN

## 2019-07-08 ENCOUNTER — Telehealth: Payer: Self-pay | Admitting: Obstetrics and Gynecology

## 2019-07-08 NOTE — Telephone Encounter (Signed)
Patient is schedule for 08/01/19 with AMS for nexplanon placement

## 2019-07-10 NOTE — Telephone Encounter (Signed)
Noted. Will order to arrive by apt date/time. 

## 2019-07-31 NOTE — Telephone Encounter (Signed)
Nexplanon reserved for this patient. 

## 2019-08-01 ENCOUNTER — Encounter: Payer: Self-pay | Admitting: Obstetrics and Gynecology

## 2019-08-01 ENCOUNTER — Ambulatory Visit (INDEPENDENT_AMBULATORY_CARE_PROVIDER_SITE_OTHER): Payer: Managed Care, Other (non HMO) | Admitting: Obstetrics and Gynecology

## 2019-08-01 ENCOUNTER — Other Ambulatory Visit: Payer: Self-pay

## 2019-08-01 DIAGNOSIS — Z1332 Encounter for screening for maternal depression: Secondary | ICD-10-CM

## 2019-08-01 DIAGNOSIS — Z30017 Encounter for initial prescription of implantable subdermal contraceptive: Secondary | ICD-10-CM

## 2019-08-01 NOTE — Progress Notes (Signed)
Postpartum Visit  Chief Complaint:  Chief Complaint  Patient presents with  . Postpartum Care    Vaginal delivery1/8  . Nexplanon insert    History of Present Illness: Patient is a 19 y.o. G2P1011 presents for postpartum visit.   Review the Delivery Report for details.  Delivery Note At 2:08 PM a viable female was delivered via Vaginal, Spontaneous.  APGAR: 8, 9; weight 7 lb 10.4 oz (3470 g).   Placenta status: Spontaneous, Intact.  Cord: 3 vessels with the  Anesthesia: Epidural Episiotomy: None Lacerations: 2nd degree Date of delivery:  06/20/2019 Type of delivery: Vaginal delivery - Vacuum or forceps assisted  no Episiotomy No.  Laceration: yes 2nd degree Pregnancy or labor problems:  Postpartum hemorrhage Any problems since the delivery:  no  Newborn Details:  SINGLETON :  1. BabyGender female. Birth weight: 7lbs 10.4oz (3470g) Maternal Details:  Breast or formula feeding: plans to bottle feed Contraception after delivery: No  Any bowel or bladder issues: No  Post partum depression/anxiety noted:  no Edinburgh Post-Partum Depression Score:0 Date of last PAP: N/A <21  Review of Systems: Review of Systems  Constitutional: Negative.   Gastrointestinal: Negative.   Genitourinary: Negative.   Psychiatric/Behavioral: Negative.     The following portions of the patient's history were reviewed and updated as appropriate: allergies, current medications, past family history, past medical history, past social history, past surgical history and problem list.  Past Medical History:  Past Medical History:  Diagnosis Date  . Anemia   . Morbid obesity with BMI of 40.0-44.9, adult [facility])     Past Surgical History:  Past Surgical History:  Procedure Laterality Date  . TONSILLECTOMY AND ADENOIDECTOMY  06/2014    Family History:  Family History  Problem Relation Age of Onset  . Hypertension Mother   . Diabetes Maternal Grandfather     Social History:  Social History     Socioeconomic History  . Marital status: Married    Spouse name: Gaynell Face  . Number of children: 0  . Years of education: 35  . Highest education level: Not on file  Occupational History  . Occupation: EMT  Tobacco Use  . Smoking status: Never Smoker  . Smokeless tobacco: Never Used  Substance and Sexual Activity  . Alcohol use: No  . Drug use: No  . Sexual activity: Yes    Birth control/protection: Implant  Other Topics Concern  . Not on file  Social History Narrative  . Not on file   Social Determinants of Health   Financial Resource Strain:   . Difficulty of Paying Living Expenses: Not on file  Food Insecurity:   . Worried About Programme researcher, broadcasting/film/video in the Last Year: Not on file  . Ran Out of Food in the Last Year: Not on file  Transportation Needs:   . Lack of Transportation (Medical): Not on file  . Lack of Transportation (Non-Medical): Not on file  Physical Activity:   . Days of Exercise per Week: Not on file  . Minutes of Exercise per Session: Not on file  Stress:   . Feeling of Stress : Not on file  Social Connections:   . Frequency of Communication with Friends and Family: Not on file  . Frequency of Social Gatherings with Friends and Family: Not on file  . Attends Religious Services: Not on file  . Active Member of Clubs or Organizations: Not on file  . Attends Banker Meetings: Not on file  .  Marital Status: Not on file  Intimate Partner Violence:   . Fear of Current or Ex-Partner: Not on file  . Emotionally Abused: Not on file  . Physically Abused: Not on file  . Sexually Abused: Not on file    Allergies:  No Known Allergies  Medications: Prior to Admission medications   Medication Sig Start Date End Date Taking? Authorizing Provider  acetaminophen (TYLENOL) 325 MG tablet Take 2 tablets (650 mg total) by mouth every 4 (four) hours as needed (for pain scale < 4). 06/22/19   Will Bonnet, MD  ibuprofen (ADVIL) 600 MG tablet Take  1 tablet (600 mg total) by mouth every 6 (six) hours. 06/22/19   Will Bonnet, MD  lidocaine-prilocaine (EMLA) cream Apply 1 application topically as needed. 06/30/19   Malachy Mood, MD  Prenatal MV-Min-FA-Omega-3 (PRENATAL GUMMIES/DHA & FA PO) Take by mouth.    [provider]    Physical Exam Blood pressure 124/80, pulse 86, weight 221 lb (100.2 kg), last menstrual period 07/28/2019, unknown if currently breastfeeding.  General: NAD HEENT: normocephalic, anicteric Pulmonary: No increased work of breathing Abdomen: NABS, soft, non-tender, non-distended.  Umbilicus without lesions.  No hepatomegaly, splenomegaly or masses palpable. No evidence of hernia. Genitourinary:  External: Normal external female genitalia.  Normal urethral meatus, normal Bartholin's and Skene's glands.    Vagina: Normal vaginal mucosa, no evidence of prolapse.    Cervix: Grossly normal in appearance, no bleeding  Uterus: Non-enlarged, mobile, normal contour.  No CMT  Adnexa: ovaries non-enlarged, no adnexal masses  Rectal: deferred Extremities: no edema, erythema, or tenderness Neurologic: Grossly intact Psychiatric: mood appropriate, affect full Edinburgh Postnatal Depression Scale - 08/01/19 1342      Edinburgh Postnatal Depression Scale:  In the Past 7 Days   I have been able to laugh and see the funny side of things.  0    I have looked forward with enjoyment to things.  0    I have blamed myself unnecessarily when things went wrong.  0    I have been anxious or worried for no good reason.  0    I have felt scared or panicky for no good reason.  0    Things have been getting on top of me.  0    I have been so unhappy that I have had difficulty sleeping.  0    I have felt sad or miserable.  0    I have been so unhappy that I have been crying.  0    The thought of harming myself has occurred to me.  0    Edinburgh Postnatal Depression Scale Total  0      GYNECOLOGY PROCEDURE  NOTE  Patient is a 20 y.o. [id number] presenting for Nexplanon insertion as her desires means of contraception.  She provided informed consent, signed copy in the chart, time out was performed.   She understands that Nexplanon is a progesterone only therapy, and that patients often patients have irregular and unpredictable vaginal bleeding or amenorrhea. She understands that other side effects are possible related to systemic progesterone, including but not limited to, headaches, breast tenderness, nausea, and irritability. While effective at preventing pregnancy long acting reversible contraceptives do not prevent transmission of sexually transmitted diseases and use of barrier methods for this purpose was discussed. The placement procedure for Nexplanon was reviewed with the patient in detail including risks of nerve injury, infection, bleeding and injury to other muscles or tendons. She understands that  the Nexplanon implant is good for 3 years and needs to be removed at the end of that time.  She understands that Nexplanon is an extremely effective option for contraception, with failure rate of <1%. This information is reviewed today and all questions were answered. Informed consent was obtained, both verbally and written.   The patient is healthy and has no contraindications to Implanon use. Urine pregnancy test was performed today and was negative.  Procedure Appropriate time out taken.  Patient placed in dorsal supine with left arm above head, elbow flexed at 90 degrees, arm resting on examination table.  The bicipital grove was palpated and site 8-10cm proximal to the medial epicondyle was indentified . The insertion site was prepped with a two betadine swabs and then injected with 3cc of 1% lidocaine without epinephrine.  Nexplanon removed form sterile blister packaging,  Device confirmed in needle, before inserting full length of needle, tenting up the skin as the needle was advance.  The drug  eluting rod was then deployed by pulling back the slider per the manufactures recommendation.  The implant was palpable by the clinician as well as the patient.  The insertion site covered dressed with a band aid before applying  a kerlex bandage pressure dressing..Minimal blood loss was noted during the procedure.  The patientt tolerated the procedure well.   She was instructed to wear the bandage for 24 hours, call with any signs of infection.  She was given the Implanon card and instructed to have the rod removed in 3 years.   Edinburgh Postnatal Depression Scale - 08/01/19 1342      Edinburgh Postnatal Depression Scale:  In the Past 7 Days   I have been able to laugh and see the funny side of things.  0    I have looked forward with enjoyment to things.  0    I have blamed myself unnecessarily when things went wrong.  0    I have been anxious or worried for no good reason.  0    I have felt scared or panicky for no good reason.  0    Things have been getting on top of me.  0    I have been so unhappy that I have had difficulty sleeping.  0    I have felt sad or miserable.  0    I have been so unhappy that I have been crying.  0    The thought of harming myself has occurred to me.  0    Edinburgh Postnatal Depression Scale Total  0      Assessment: 19 y.o. [id number] presenting for 6 week postpartum visit  Plan: Problem List Items Addressed This Visit    None    Visit Diagnoses    6 weeks postpartum follow-up    -  Primary   Nexplanon insertion           1) Contraception - Education given regarding options for contraception, as well as compatibility with breast feeding if applicable.  Patient plans on Nexplanon for contraception.  2)  Pap - ASCCP guidelines and rational discussed.  ASCCP guidelines and rational discussed.  Start routine screening at age 27  3) Patient underwent screening for postpartum depression with no signs of depression  4) Return in about 1 year (around  07/31/2020) for annual.   Vena Austria, MD, [facility], [facility] 08/01/2019, 1:29 PM

## 2019-08-25 NOTE — Telephone Encounter (Signed)
Nexplanon rcvd/charged 08/01/2019

## 2019-12-30 ENCOUNTER — Encounter: Payer: Self-pay | Admitting: Obstetrics and Gynecology

## 2019-12-30 ENCOUNTER — Ambulatory Visit (INDEPENDENT_AMBULATORY_CARE_PROVIDER_SITE_OTHER): Payer: Managed Care, Other (non HMO) | Admitting: Obstetrics and Gynecology

## 2019-12-30 ENCOUNTER — Other Ambulatory Visit: Payer: Self-pay

## 2019-12-30 VITALS — BP 120/80 | Ht 62.0 in | Wt 227.0 lb

## 2019-12-30 DIAGNOSIS — Z3046 Encounter for surveillance of implantable subdermal contraceptive: Secondary | ICD-10-CM | POA: Diagnosis not present

## 2019-12-30 DIAGNOSIS — Z30011 Encounter for initial prescription of contraceptive pills: Secondary | ICD-10-CM

## 2019-12-30 MED ORDER — NORETHINDRONE 0.35 MG PO TABS: 1.0000 | ORAL_TABLET | Freq: Every day | ORAL | 2 refills | Status: DC

## 2019-12-30 NOTE — Progress Notes (Signed)
Chief Complaint  Patient presents with  . Contraception    Nexplanon removal due to vaginal bleeding and no sex drive, might be interested in patch     History of Present Illness:  Karen Woodward is a 19 y.o. that had a nexplanon placed approximately 5 months  ago at [facility] visit. Since that time, she is having irregular bleeding, lasting 7-10 days, mod flow, no dysmen. Also having decreased libido which is problematic for marriage. She would like it removed and wants xulane. Did xulane and OCPs in past. Doesn't want IUD. Pt with hx of migraines with aura. No HTN, DVTs.   Past Medical History:  Diagnosis Date  . Anemia   . Migraine with aura   . Morbid obesity with BMI of 40.0-44.9, adult [facility])    Past Surgical History:  Procedure Laterality Date  . TONSILLECTOMY AND ADENOIDECTOMY  06/2014   Review of Systems  Constitutional: Negative for fever.  Gastrointestinal: Negative for blood in stool, constipation, diarrhea, nausea and vomiting.  Genitourinary: Positive for vaginal bleeding. Negative for dyspareunia, dysuria, flank pain, frequency, hematuria, menstrual problem, urgency, vaginal discharge and vaginal pain.  Musculoskeletal: Negative for back pain.  Skin: Negative for rash.   Physical Exam Constitutional:      Appearance: She is well-developed.  Pulmonary:     Effort: Pulmonary effort is normal.  Musculoskeletal:        General: Normal range of motion.     Cervical back: Normal range of motion.  Neurological:     Mental Status: She is alert and oriented to person, place, and time.  Skin:    General: Skin is warm.  Psychiatric:        Behavior: Behavior normal.        Thought Content: Thought content normal.      BP 120/80   Ht 5\' 2"  (1.575 m)   Wt 227 lb (103 kg)   Breastfeeding No   BMI 41.52 kg/m     Nexplanon removal Procedure note - The Nexplanon was noted in the patient's arm and the end was identified. The skin was cleansed with a Betadine solution. A  small injection of subcutaneous lidocaine with epinephrine was given over the end of the implant. An incision was made at the end of the implant. The rod was noted in the incision and grasped with a hemostat. It was noted to be intact.  Steri-Strip was placed approximating the incision. Hemostasis was noted.  Assessment: Encounter for initial prescription of contraceptive pills - Plan: norethindrone (MICRONOR) 0.35 MG tablet; BC options discussed. Pt can only have prog only. Would like to try POPs. Rx eRxd. Start today, condoms for 1 wk. Pt aware to take same time daily. F/u prn/ 7 mo annual.   Decreased libido--see if sx improve with nexplanon removal. Suggested IUD, but pt not interested. F/u prn.   Nexplanon removal   Meds ordered this encounter  Medications  . norethindrone (MICRONOR) 0.35 MG tablet    Sig: Take 1 tablet (0.35 mg total) by mouth daily.    Dispense:  84 tablet    Refill:  2    Order Specific Question:   Supervising Provider    Answer:   Nadara Mustard    Plan:   She was told to remove the dressing in 12-24 hours, to keep the incision area dry for 24 hours and to remove the Steristrip in 2-3  days.  Notify 05-27-1992 if any signs of tenderness, redness, pain, or  fevers develop.   Joyce Heitman B. Samier Jaco, PA-C 12/30/2019 5:13 PM

## 2019-12-30 NOTE — Patient Instructions (Signed)
I value your feedback and entrusting us with your care. If you get a [facility] patient survey, I would appreciate you taking the time to let us know about your experience today. Thank you!  As of May 22, 2019, your lab results will be released to your MyChart immediately, before I even have a chance to see them. Please give me time to review them and contact you if there are any abnormalities. Thank you for your patience.   Remove the dressing in 24 hours,  keep the incision area dry for 24 hours and remove the Steristrip in 2-3  days.  Notify us if any signs of tenderness, redness, pain, or fevers develop.  

## 2020-01-13 ENCOUNTER — Encounter: Payer: Self-pay | Admitting: Obstetrics and Gynecology

## 2020-03-01 IMAGING — US US PELVIS COMPLETE WITH TRANSVAGINAL
1 series · 13 of 25 positions shown · non-contrast
Comparison: None

CLINICAL DATA: Vaginal bleeding.

EXAM:
TRANSABDOMINAL AND TRANSVAGINAL ULTRASOUND OF PELVIS
TECHNIQUE: Both transabdominal and transvaginal ultrasound examinations of the
pelvis were performed. Transabdominal technique was performed for
global imaging of the pelvis including uterus, ovaries, adnexal
regions, and pelvic cul-de-sac. It was necessary to proceed with
endovaginal exam following the transabdominal exam to visualize the
endometrium and ovaries.

[Series 1: us pelvis complete with transvaginal · 13 of 64 slices shown]
[im 1/64]
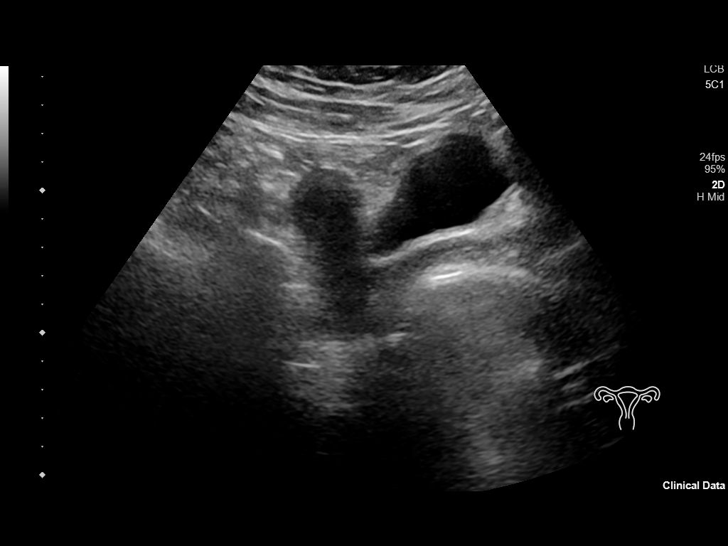
[im 6/64]
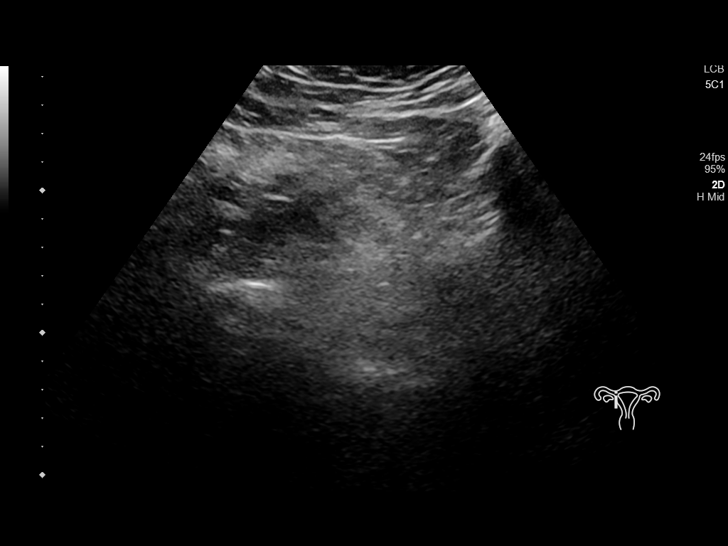
[im 11/64]
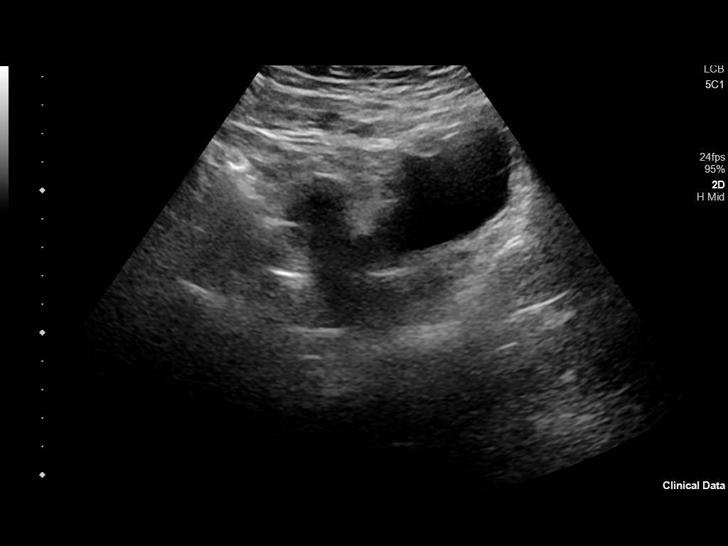
[im 16/64]
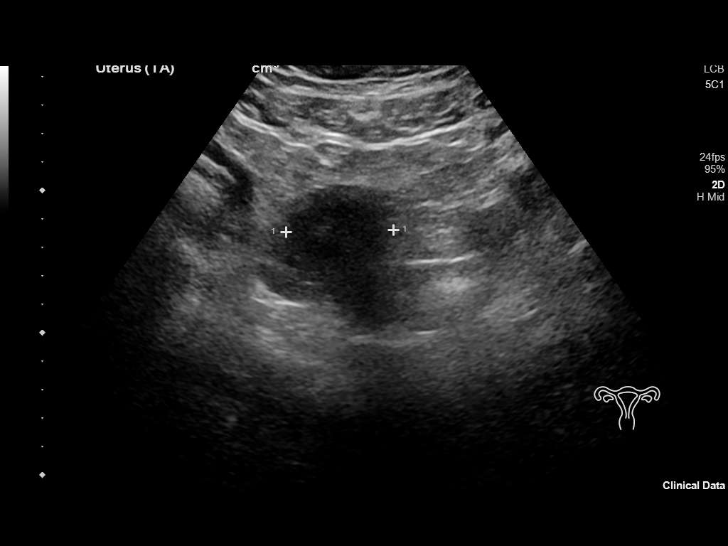
[im 22/64]
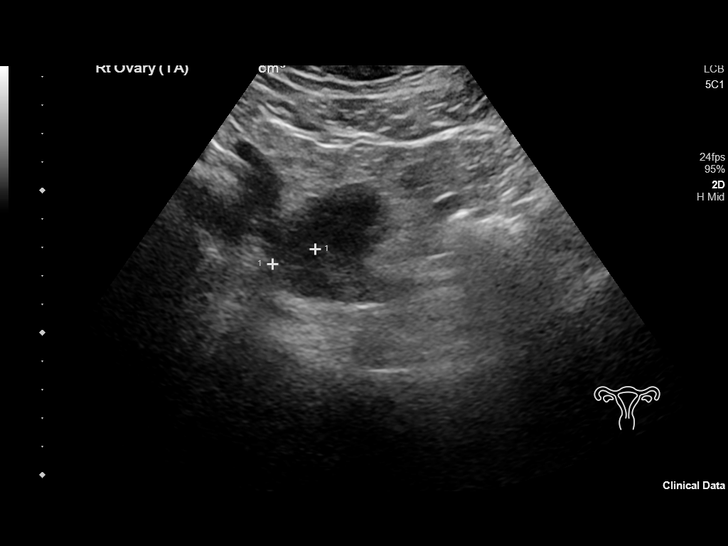
[im 27/64]
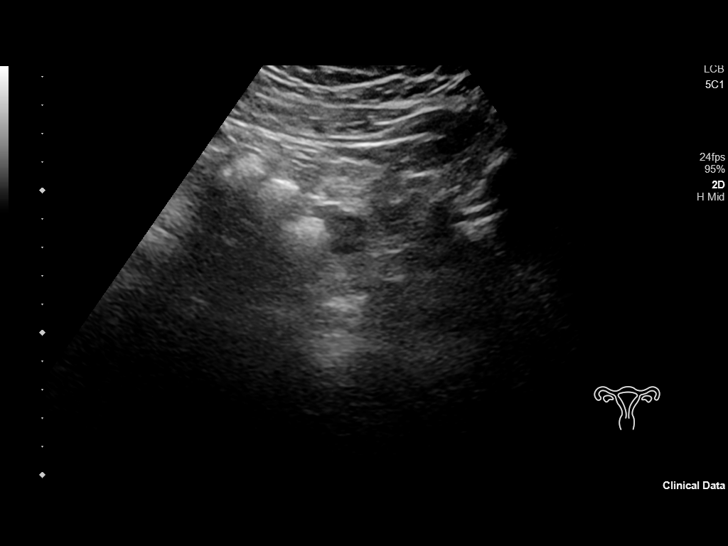
[im 32/64]
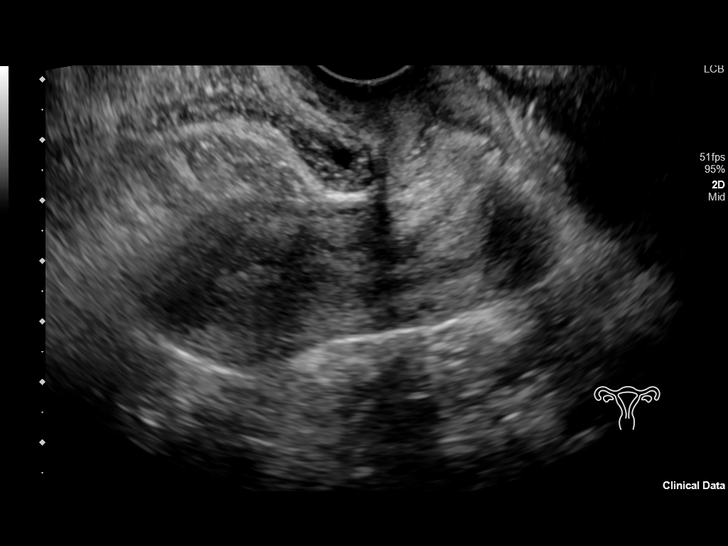
[im 37/64]
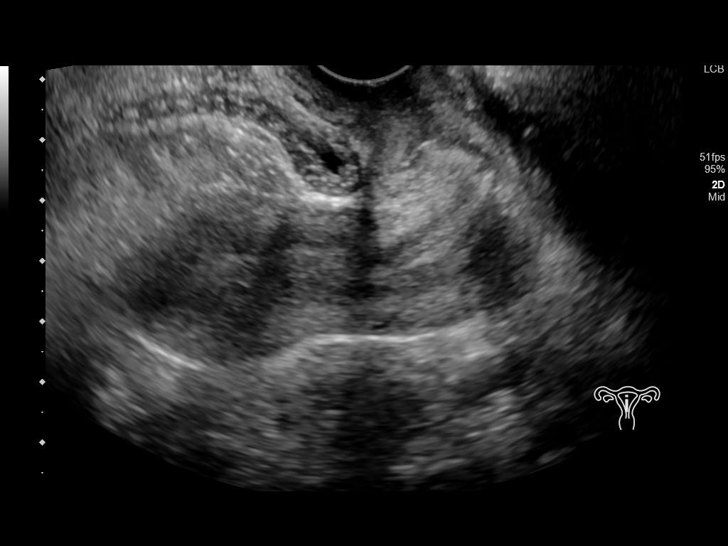
[im 43/64]
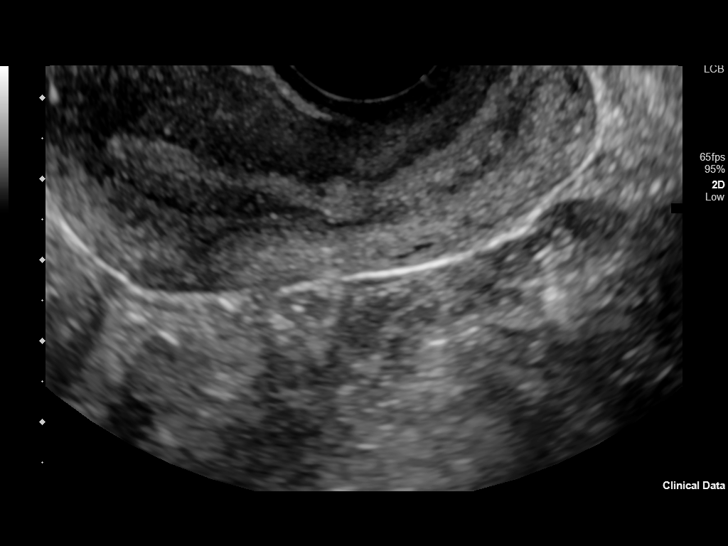
[im 48/64]
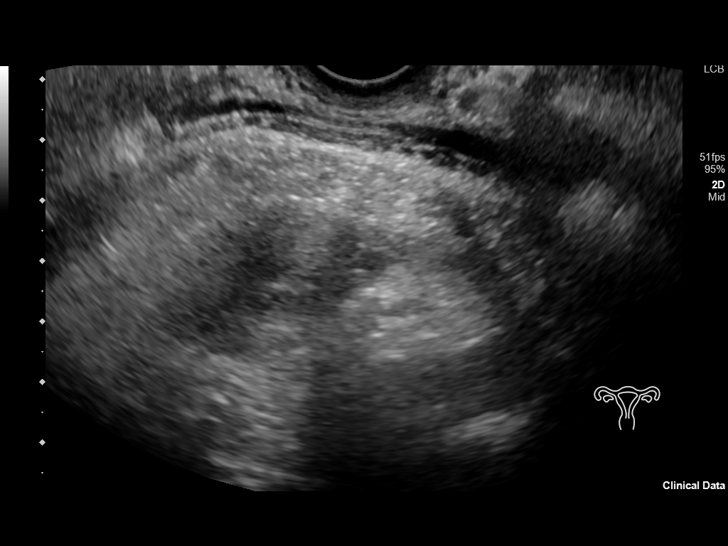
[im 53/64]
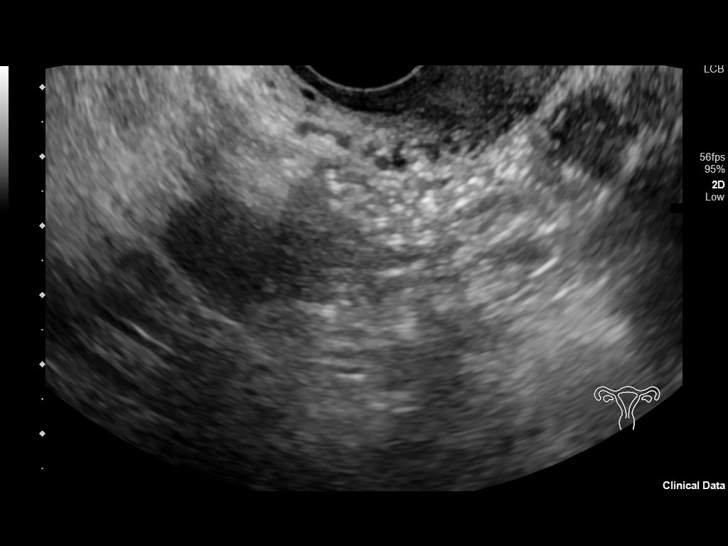
[im 58/64]
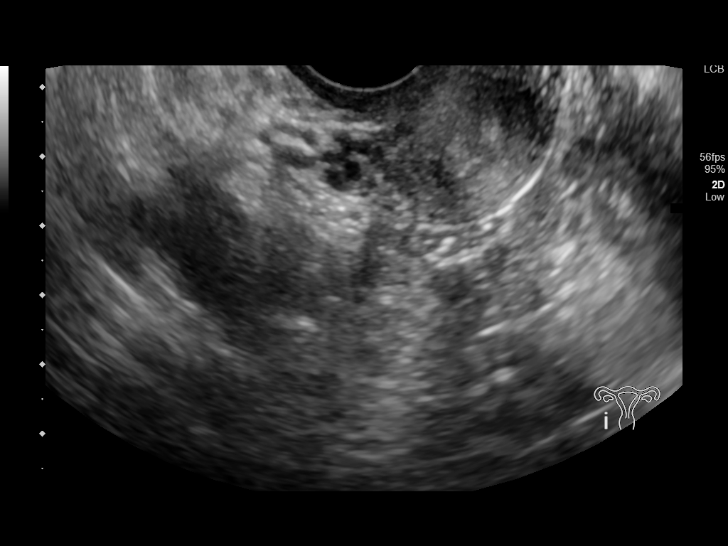
[im 64/64]
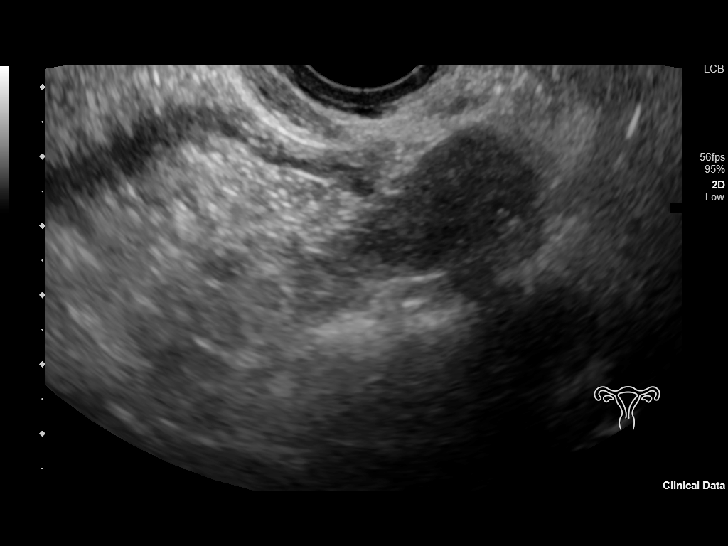

[13 of 25 positions shown; findings below may reference images not displayed]

FINDINGS: Uterus

Measurements: 6.8 x 2.8 x 3.4 cm = volume: 34 mL. No fibroids or
other mass visualized.

Endometrium

Thickness: 5 mm.  No focal abnormality visualized.

Right ovary

Measurements: 2.6 x 1.6 x 1.3 cm = volume: 2.8 mL. Normal
appearance/no adnexal mass.

Left ovary

Measurements: 2.0 x 1.7 x 1.6 cm = volume: 3.0 mL. Normal
appearance/no adnexal mass.

Other findings

No abnormal free fluid.
IMPRESSION: 1. The endometrial stripe thickness is normal measuring 5 mm. If
bleeding remains unresponsive to hormonal or medical therapy,
sonohysterogram should be considered for focal lesion work-up. (Ref:
Radiological Reasoning: Algorithmic Workup of Abnormal Vaginal
Bleeding with Endovaginal Sonography and Sonohysterography. AJR
4000; 191:S68-73)
2. No other abnormalities.

## 2021-02-27 ENCOUNTER — Other Ambulatory Visit: Payer: Self-pay | Admitting: Obstetrics and Gynecology

## 2021-02-27 DIAGNOSIS — Z30011 Encounter for initial prescription of contraceptive pills: Secondary | ICD-10-CM

## 2021-02-28 ENCOUNTER — Telehealth: Payer: Self-pay

## 2021-02-28 NOTE — Telephone Encounter (Signed)
Patient is requesting birth control refill. Patient is scheduled with AMS on 03/24/21 at 9:30. Please advise

## 2021-03-07 ENCOUNTER — Other Ambulatory Visit: Payer: Self-pay | Admitting: Obstetrics and Gynecology

## 2021-03-07 DIAGNOSIS — Z30011 Encounter for initial prescription of contraceptive pills: Secondary | ICD-10-CM

## 2021-03-07 MED ORDER — NORETHINDRONE 0.35 MG PO TABS: 1.0000 | ORAL_TABLET | Freq: Every day | ORAL | 0 refills | Status: DC

## 2021-03-07 NOTE — Telephone Encounter (Signed)
Rx sent 

## 2021-03-24 ENCOUNTER — Other Ambulatory Visit (HOSPITAL_COMMUNITY)
Admission: RE | Admit: 2021-03-24 | Discharge: 2021-03-24 | Disposition: A | Discharge status: Home / Self Care | Payer: Managed Care, Other (non HMO) | Source: Ambulatory Visit | Attending: Obstetrics and Gynecology | Admitting: Obstetrics and Gynecology

## 2021-03-24 ENCOUNTER — Encounter: Payer: Self-pay | Admitting: Obstetrics and Gynecology

## 2021-03-24 ENCOUNTER — Other Ambulatory Visit: Payer: Self-pay

## 2021-03-24 ENCOUNTER — Ambulatory Visit (INDEPENDENT_AMBULATORY_CARE_PROVIDER_SITE_OTHER): Payer: Managed Care, Other (non HMO) | Admitting: Obstetrics and Gynecology

## 2021-03-24 VITALS — BP 122/78 | HR 91 | Ht 63.0 in | Wt 196.0 lb

## 2021-03-24 DIAGNOSIS — Z124 Encounter for screening for malignant neoplasm of cervix: Secondary | ICD-10-CM | POA: Diagnosis not present

## 2021-03-24 DIAGNOSIS — Z01419 Encounter for gynecological examination (general) (routine) without abnormal findings: Secondary | ICD-10-CM | POA: Diagnosis not present

## 2021-03-24 DIAGNOSIS — Z113 Encounter for screening for infections with a predominantly sexual mode of transmission: Secondary | ICD-10-CM | POA: Insufficient documentation

## 2021-03-24 NOTE — Progress Notes (Signed)
Gynecology Annual Exam  PCP: Karen Woodward, No Pcp Per (Inactive)  Chief Complaint:  Chief Complaint  Karen Woodward presents with   Gynecologic Exam    Annual - no concerns. RM 5    History of Present Illness: Karen Woodward is a 20 y.o. G2P1011 presents for annual exam. The Karen Woodward has no complaints today.   LMP: Karen Woodward's last menstrual period was 02/25/2021 (approximate).  Average Interval: regular monthly Duration of flow: 7 days Heavy Menses: yes Clots: yes Intermenstrual Bleeding: no Postcoital Bleeding: no Dysmenorrhea: yes  The Karen Woodward is sexually active. She currently uses none for contraception. She denies dyspareunia.  The Karen Woodward does perform self breast exams.  There is no notable family history of breast or ovarian cancer in her family.  The Karen Woodward wears seatbelts: yes.  The Karen Woodward has regular exercise: not asked.    The Karen Woodward denies current symptoms of depression.    Review of Systems: Review of Systems  Constitutional: Negative.  Negative for chills and fever.  HENT:  Negative for congestion.   Respiratory:  Negative for cough and shortness of breath.   Cardiovascular:  Negative for chest pain and palpitations.  Gastrointestinal: Negative.  Negative for abdominal pain, constipation, diarrhea, heartburn, nausea and vomiting.  Genitourinary: Negative.  Negative for dysuria, frequency and urgency.  Skin:  Negative for itching and rash.  Neurological:  Negative for dizziness and headaches.  Endo/Heme/Allergies:  Negative for polydipsia.  Psychiatric/Behavioral:  Negative for depression.    Past Medical History:  Karen Woodward Active Problem List   Diagnosis Date Noted   [redacted] weeks gestation of pregnancy 06/19/2019   Elevated blood pressure complicating pregnancy in third trimester, antepartum 06/11/2019   Labor and delivery, indication for care 05/09/2019   Abnormal yolk sac 11/21/2018   Menometrorrhagia 05/16/2018   Soft tissue swelling of back 05/16/2018   Dysmenorrhea  05/16/2018    Past Surgical History:  Past Surgical History:  Procedure Laterality Date   TONSILLECTOMY AND ADENOIDECTOMY  06/2014    Gynecologic History:  Karen Woodward's last menstrual period was 02/25/2021 (approximate). Contraception: none Last Pap: Results were: N/A just turned 19  Obstetric History: G2P1011  Family History:  Family History  Problem Relation Age of Onset   Hypertension Mother    Diabetes Maternal Grandfather     Social History:  Social History   Socioeconomic History   Marital status: Married    Spouse name: Gaynell Face   Number of children: 0   Years of education: 13   Highest education level: Not on file  Occupational History   Occupation: EMT  Tobacco Use   Smoking status: Never   Smokeless tobacco: Never  Vaping Use   Vaping Use: Every day   Substances: Nicotine  Substance and Sexual Activity   Alcohol use: No   Drug use: No   Sexual activity: Yes    Birth control/protection: None  Other Topics Concern   Not on file  Social History Narrative   Not on file   Social Determinants of Health   Financial Resource Strain: Not on file  Food Insecurity: Not on file  Transportation Needs: Not on file  Physical Activity: Not on file  Stress: Not on file  Social Connections: Not on file  Intimate Partner Violence: Not on file    Allergies:  No Known Allergies  Medications: Prior to Admission medications   Medication Sig Start Date End Date Taking? Authorizing Provider  norethindrone (MICRONOR) 0.35 MG tablet Take 1 tablet (0.35 mg total) by mouth daily.  Karen Woodward not taking: Reported on 03/24/2021 03/07/21   Vena Austria, MD    Physical Exam Vitals: Blood pressure 122/78, pulse 91, height 5\' 3"  (1.6 m), weight 196 lb (88.9 kg), last menstrual period 02/25/2021, not currently breastfeeding.  General: NAD HEENT: normocephalic, anicteric Thyroid: no enlargement, no palpable nodules Pulmonary: No increased work of breathing,  CTAB Cardiovascular: RRR, distal pulses 2+ Breast: Breast symmetrical, no tenderness, no palpable nodules or masses, no skin or nipple retraction present, no nipple discharge.  No axillary or supraclavicular lymphadenopathy. Abdomen: NABS, soft, non-tender, non-distended.  Umbilicus without lesions.  No hepatomegaly, splenomegaly or masses palpable. No evidence of hernia  Genitourinary:  External: Normal external female genitalia.  Normal urethral meatus, normal Bartholin's and Skene's glands.    Vagina: Normal vaginal mucosa, no evidence of prolapse.    Cervix: Grossly normal in appearance, no bleeding  Uterus: Non-enlarged, mobile, normal contour.  No CMT  Adnexa: ovaries non-enlarged, no adnexal masses  Rectal: deferred  Lymphatic: no evidence of inguinal lymphadenopathy Extremities: no edema, erythema, or tenderness Neurologic: Grossly intact Psychiatric: mood appropriate, affect full  Female chaperone present for pelvic and breast  portions of the physical exam  Immunization History  Administered Date(s) Administered   Tdap 04/28/2019     Assessment: 20 y.o. G2P1011 routine annual exam  Plan: Problem List Items Addressed This Visit   None Visit Diagnoses     Encounter for gynecological examination without abnormal finding    -  Primary   Screening for malignant neoplasm of cervix       Relevant Orders   Cytology - PAP   Routine screening for STI (sexually transmitted infection)       Relevant Orders   Cytology - PAP       1) 4) Gardasil Series discussed and if applicable offered to Karen Woodward 2) STI screening  was notoffered and declined  3)  ASCCP guidelines and rational discussed.  Karen Woodward opts for every 3 years screening interval  4) Contraception - the Karen Woodward is currently using  none.  She is attempting to conceive in the near future We discussed safe sex practices to reduce her furture risk of STI's.    5) Return in about 1 year (around 03/24/2022) for  annual.   03/26/2022, MD, Vena Austria OB/GYN, [facility] 03/24/2021, 9:36 AM

## 2021-03-28 LAB — CYTOLOGY - PAP
Comment: NEGATIVE
Diagnosis: NEGATIVE
High risk HPV: NEGATIVE

## 2021-04-20 ENCOUNTER — Encounter: Payer: Self-pay | Admitting: Obstetrics and Gynecology

## 2021-04-20 ENCOUNTER — Other Ambulatory Visit: Payer: Self-pay

## 2021-04-20 ENCOUNTER — Encounter: Payer: Managed Care, Other (non HMO) | Admitting: Obstetrics and Gynecology

## 2021-04-20 ENCOUNTER — Ambulatory Visit (INDEPENDENT_AMBULATORY_CARE_PROVIDER_SITE_OTHER): Payer: Managed Care, Other (non HMO) | Admitting: Obstetrics and Gynecology

## 2021-04-20 ENCOUNTER — Other Ambulatory Visit: Payer: Managed Care, Other (non HMO)

## 2021-04-20 VITALS — BP 116/77 | HR 88 | Wt 190.0 lb

## 2021-04-20 DIAGNOSIS — Z3480 Encounter for supervision of other normal pregnancy, unspecified trimester: Secondary | ICD-10-CM | POA: Insufficient documentation

## 2021-04-20 DIAGNOSIS — O09299 Supervision of pregnancy with other poor reproductive or obstetric history, unspecified trimester: Secondary | ICD-10-CM | POA: Insufficient documentation

## 2021-04-20 DIAGNOSIS — N912 Amenorrhea, unspecified: Secondary | ICD-10-CM | POA: Diagnosis not present

## 2021-04-20 DIAGNOSIS — Z369 Encounter for antenatal screening, unspecified: Secondary | ICD-10-CM

## 2021-04-20 LAB — POCT URINALYSIS DIPSTICK OB
Appearance: ABNORMAL
Bilirubin, UA: NEGATIVE
Blood, UA: NEGATIVE
Glucose, UA: NEGATIVE
Ketones, UA: NEGATIVE
Nitrite, UA: NEGATIVE
Odor: NORMAL
Spec Grav, UA: 1.02 (ref 1.010–1.025)
Urobilinogen, UA: 0.2 E.U./dL
pH, UA: 6 (ref 5.0–8.0)

## 2021-04-20 LAB — POCT URINE PREGNANCY: Preg Test, Ur: POSITIVE — AB

## 2021-04-20 MED ORDER — ONDANSETRON 4 MG PO TBDP
4.0000 mg | ORAL_TABLET | Freq: Four times a day (QID) | ORAL | 0 refills | Status: DC | PRN
Start: 2021-04-20 — End: 2021-08-24

## 2021-04-20 MED ORDER — DOXYLAMINE-PYRIDOXINE 10-10 MG PO TBEC: 2.0000 | DELAYED_RELEASE_TABLET | Freq: Every day | ORAL | 5 refills | Status: DC

## 2021-04-20 NOTE — Progress Notes (Signed)
New Obstetric Patient H&P    Chief Complaint: "Desires prenatal care"   History of Present Illness: Patient is a 20 y.o. G2P1011 Not Hispanic or Latino female, presents with amenorrhea and positive home pregnancy test. Patient's last menstrual period was 02/27/2021. and based on her  LMP, her EDD is Estimated Date of Delivery: 12/04/21. and her EGA is 102w3d. Her last pap smear was 03/24/2021 and was no abnormalities.    Her last menstrual period was normal. Since her LMP she claims she has experienced some nausea. She denies vaginal bleeding. Her past medical history is noncontributory. Her prior pregnancies are notable for postpartum hemorrhage, history of preeclampsia  Since her LMP, she admits to the use of tobacco products  no There are cats in the home in the home  no  She admits close contact with children on a regular basis  no  She has had chicken pox in the past no She has had Tuberculosis exposures, symptoms, or previously tested positive for TB   no Current or past history of domestic violence. no  Genetic Screening/Teratology Counseling: (Includes patient, baby's father, or anyone in either family with:)   1. Patient's age >/= 44 at [facility]  no 2. Thalassemia ([place], [place], Mediterranean, or Asian background): MCV<80  no 3. Neural tube defect (meningomyelocele, spina bifida, anencephaly)  no 4. Congenital heart defect  no  5. Down syndrome  no 6. Tay-Sachs (Jewish, [place]  no 7. Canavan's Disease  no 8. Sickle cell disease or trait (African)  no  9. Hemophilia or other blood disorders  no  10. Muscular dystrophy  no  11. Cystic fibrosis  no  12. Huntington's Chorea  no  13. Mental retardation/autism  no 14. Other inherited genetic or chromosomal disorder  no 15. Maternal metabolic disorder (DM, PKU, etc)  no 16. Patient or FOB with a child with a birth defect not listed above no  16a. Patient or FOB with a birth defect themselves no 17. Recurrent pregnancy  loss, or stillbirth  no  18. Any medications since LMP other than prenatal vitamins (include vitamins, supplements, OTC meds, drugs, alcohol)  no 19. Any other genetic/environmental exposure to discuss  no  Infection History:   1. Lives with someone with TB or TB exposed  no  2. Patient or partner has history of genital herpes  no 3. Rash or viral illness since LMP  no 4. History of STI (GC, CT, HPV, syphilis, HIV)  no 5. History of recent travel :  no  Other pertinent information:  no     Review of Systems:10 point review of systems negative unless otherwise noted in HPI  Past Medical History:  Patient Active Problem List   Diagnosis Date Noted   [redacted] weeks gestation of pregnancy 06/19/2019   Elevated blood pressure complicating pregnancy in third trimester, antepartum 06/11/2019   Labor and delivery, indication for care 05/09/2019   Abnormal yolk sac 11/21/2018   Menometrorrhagia 05/16/2018   Soft tissue swelling of back 05/16/2018   Dysmenorrhea 05/16/2018    Past Surgical History:  Past Surgical History:  Procedure Laterality Date   TONSILLECTOMY AND ADENOIDECTOMY  06/2014    Gynecologic History: No LMP recorded. (Menstrual status: Oral contraceptives).  Obstetric History: G2P1011  Family History:  Family History  Problem Relation Age of Onset   Hypertension Mother    Diabetes Maternal Grandfather     Social History:  Social History   Socioeconomic History   Marital status: Married  Spouse name: Gaynell Face   Number of children: 0   Years of education: 13   Highest education level: Not on file  Occupational History   Occupation: EMT  Tobacco Use   Smoking status: Never   Smokeless tobacco: Never  Vaping Use   Vaping Use: Every day   Substances: Nicotine  Substance and Sexual Activity   Alcohol use: No   Drug use: No   Sexual activity: Yes    Birth control/protection: None  Other Topics Concern   Not on file  Social History Narrative   Not on  file   Social Determinants of Health   Financial Resource Strain: Not on file  Food Insecurity: Not on file  Transportation Needs: Not on file  Physical Activity: Not on file  Stress: Not on file  Social Connections: Not on file  Intimate Partner Violence: Not on file    Allergies:  No Known Allergies  Medications: Prior to Admission medications   Medication Sig Start Date End Date Taking? Authorizing Provider  norethindrone (MICRONOR) 0.35 MG tablet Take 1 tablet (0.35 mg total) by mouth daily. Patient not taking: Reported on 03/24/2021 03/07/21   Vena Austria, MD    Physical Exam Vitals: Blood pressure 116/77, pulse 88, weight 190 lb (86.2 kg), last menstrual period 02/27/2021, not currently breastfeeding. Body mass index is 33.66 kg/m.   General: NAD HEENT: normocephalic, anicteric Thyroid: no enlargement, no palpable nodules Pulmonary: No increased work of breathing, CTAB Cardiovascular: RRR, distal pulses 2+ Abdomen: NABS, soft, non-tender, non-distended.  Umbilicus without lesions.  No hepatomegaly, splenomegaly or masses palpable. No evidence of hernia  Genitourinary:  External: Normal external female genitalia.  Normal urethral meatus, normal  Bartholin's and Skene's glands.    Vagina: Normal vaginal mucosa, no evidence of prolapse.    Cervix: Grossly normal in appearance, no bleeding  Uterus:  Non-enlarged, mobile, normal contour.  No CMT  Adnexa: ovaries non-enlarged, no adnexal masses  Rectal: deferred Extremities: no edema, erythema, or tenderness Neurologic: Grossly intact Psychiatric: mood appropriate, affect full   Assessment: 20 y.o. G2P1011 at Unknown presenting to initiate prenatal care  Plan: 1) Avoid alcoholic beverages. 2) Patient encouraged not to smoke.  3) Discontinue the use of all non-medicinal drugs and chemicals.  4) Take prenatal vitamins daily.  5) Nutrition, food safety (fish, cheese advisories, and high nitrite foods) and  exercise discussed. 6) Hospital and practice style discussed with cross coverage system.  7) Genetic Screening, such as with 1st Trimester Screening, cell free fetal DNA, AFP testing, and Ultrasound, as well as with amniocentesis and CVS as appropriate, is discussed with patient. At the conclusion of today's visit patient requested genetic testing 8) Inheritest previously negative 11/30/2018  Vena Austria, MD, [facility], [facility] 04/20/2021, 9:34 AM

## 2021-04-21 LAB — RPR+RH+ABO+RUB AB+AB SCR+CB...
Antibody Screen: NEGATIVE
HIV Screen 4th Generation wRfx: NONREACTIVE
Hematocrit: 38.6 % (ref 34.0–46.6)
Hemoglobin: 12.5 g/dL (ref 11.1–15.9)
Hepatitis B Surface Ag: NEGATIVE
MCH: 24.4 pg — ABNORMAL LOW (ref 26.6–33.0)
MCHC: 32.4 g/dL (ref 31.5–35.7)
MCV: 75 fL — ABNORMAL LOW (ref 79–97)
Platelets: 398 10*3/uL (ref 150–450)
RBC: 5.12 x10E6/uL (ref 3.77–5.28)
RDW: 17.2 % — ABNORMAL HIGH (ref 11.7–15.4)
RPR Ser Ql: NONREACTIVE
Rubella Antibodies, IGG: 4.83 index (ref >0.99)
Varicella zoster IgG: <135 index — ABNORMAL LOW (ref >165)
WBC: 7.9 10*3/uL (ref 3.4–10.8)

## 2021-04-21 LAB — COMPREHENSIVE METABOLIC PANEL
ALT: 38 IU/L — ABNORMAL HIGH (ref 0–32)
AST: 22 IU/L (ref 0–40)
Albumin/Globulin Ratio: 2 (ref 1.2–2.2)
Albumin: 4.4 g/dL (ref 3.9–5.0)
Alkaline Phosphatase: 86 IU/L (ref 44–121)
BUN/Creatinine Ratio: 10 (ref 9–23)
BUN: 6 mg/dL (ref 6–20)
Bilirubin Total: <0.2 mg/dL (ref 0.0–1.2)
CO2: 18 mmol/L — ABNORMAL LOW (ref 20–29)
Calcium: 10.5 mg/dL — ABNORMAL HIGH (ref 8.7–10.2)
Chloride: 104 mmol/L (ref 96–106)
Creatinine, Ser: 0.6 mg/dL (ref 0.57–1.00)
Globulin, Total: 2.2 g/dL (ref 1.5–4.5)
Glucose: 86 mg/dL (ref 70–99)
Potassium: 4.2 mmol/L (ref 3.5–5.2)
Sodium: 138 mmol/L (ref 134–144)
Total Protein: 6.6 g/dL (ref 6.0–8.5)
eGFR: 130 mL/min/{1.73_m2} (ref >59)

## 2021-04-21 LAB — PROTEIN / CREATININE RATIO, URINE
Creatinine, Urine: 183.7 mg/dL
Protein, Ur: 17.3 mg/dL
Protein/Creat Ratio: 94 mg/g creat (ref 0–200)

## 2021-04-25 LAB — URINE CULTURE

## 2021-05-04 ENCOUNTER — Other Ambulatory Visit: Payer: Self-pay

## 2021-05-04 ENCOUNTER — Ambulatory Visit (INDEPENDENT_AMBULATORY_CARE_PROVIDER_SITE_OTHER): Payer: Managed Care, Other (non HMO) | Admitting: Obstetrics and Gynecology

## 2021-05-04 ENCOUNTER — Encounter: Payer: Self-pay | Admitting: Obstetrics and Gynecology

## 2021-05-04 VITALS — BP 123/84 | Wt 192.0 lb

## 2021-05-04 DIAGNOSIS — Z3A09 9 weeks gestation of pregnancy: Secondary | ICD-10-CM

## 2021-05-04 DIAGNOSIS — Z1379 Encounter for other screening for genetic and chromosomal anomalies: Secondary | ICD-10-CM

## 2021-05-04 DIAGNOSIS — O09299 Supervision of pregnancy with other poor reproductive or obstetric history, unspecified trimester: Secondary | ICD-10-CM

## 2021-05-04 DIAGNOSIS — Z3481 Encounter for supervision of other normal pregnancy, first trimester: Secondary | ICD-10-CM

## 2021-05-04 NOTE — Progress Notes (Signed)
ULTRASOUND REPORT  Location: [facility] Date of Service: 05/04/2021   Indications:dating (Transabdominal) Findings:  Singleton intrauterine pregnancy is visualized with a CRL consistent with [redacted]w[redacted]d gestation, giving an (U/S) EDD of 12/07/2021. The (U/S) EDD is consistent with the clinically established EDD of 12/04/2024.  FHR: 158 BPM CRL measurement: 23.2 mm Yolk sac is visualized and appears normal. Amnion: visualized and appears normal   Right Ovary is not visualized. Left Ovary is normal appearance. Corpus luteal cyst:  is not visualized Survey of the adnexa demonstrates no adnexal masses. There is no free peritoneal fluid in the cul de sac.  Impression: 1. [redacted]w[redacted]d Viable Singleton Intrauterine pregnancy by U/S. 2. (U/S) EDD is consistent with Clinically established EDD of 12/04/2021.  There is a viable singleton gestation.  Detailed evaluation of the fetal anatomy is precluded by early gestational age.  It must be noted that a normal ultrasound particular at this early gestational age is unable to rule out fetal aneuploidy, risk of first trimester miscarriage, or anatomic birth defects.  I personally performed the ultrasound and interpreted the images.   Thomasene Mohair, MD, [facility], [facility] 05/04/2021 1:41 PM

## 2021-05-04 NOTE — Progress Notes (Signed)
Routine Prenatal Care Visit  Subjective  Karen Woodward is a 20 y.o. G3P1011 at [redacted]w[redacted]d being seen today for ongoing prenatal care.  She is currently monitored for the following issues for this low-risk pregnancy and has Menometrorrhagia; Soft tissue swelling of back; Dysmenorrhea; History of postpartum hemorrhage, currently pregnant; Hx of preeclampsia, prior pregnancy, currently pregnant; and Supervision of other normal pregnancy, antepartum on their problem list.  ----------------------------------------------------------------------------------- Patient reports no complaints.    . Vag. Bleeding: None.   . Leaking Fluid denies.  ----------------------------------------------------------------------------------- The following portions of the patient's history were reviewed and updated as appropriate: allergies, current medications, past family history, past medical history, past social history, past surgical history and problem list. Problem list updated.  Objective  Blood pressure 123/84, weight 192 lb (87.1 kg), last menstrual period 02/27/2021, not currently breastfeeding. Pregravid weight 195 lb (88.5 kg) Total Weight Gain -3 lb (-1.361 kg) Urinalysis: Urine Protein    Urine Glucose    Fetal Status: Fetal Heart Rate (bpm): 158         General:  Alert, oriented and cooperative. Patient is in no acute distress.  Skin: Skin is warm and dry. No rash noted.   Cardiovascular: Normal heart rate noted  Respiratory: Normal respiratory effort, no problems with respiration noted  Abdomen: Soft, gravid, appropriate for gestational age.       Pelvic:  Cervical exam deferred        Extremities: Normal range of motion.     Mental Status: Normal mood and affect. Normal behavior. Normal judgment and thought content.   Assessment   20 y.o. G3P1011 at [redacted]w[redacted]d by  12/04/2021, by Last Menstrual Period presenting for routine prenatal visit  Plan   Pregnancy #3 Problems (from 04/20/21 to present)      Problem Noted Resolved   History of postpartum hemorrhage, currently pregnant 04/20/2021 by Vena Austria, MD No   Overview Signed 05/04/2021  1:43 PM by Conard Novak, MD    Received 2 units pRBCs      Hx of preeclampsia, prior pregnancy, currently pregnant 04/20/2021 by Vena Austria, MD No   Supervision of other normal pregnancy, antepartum 04/20/2021 by Vena Austria, MD No   Overview Signed 05/04/2021  1:44 PM by Conard Novak, MD     Nursing Staff Provider  Office Location  [facility] Dating  L=9wks  Language  English Anatomy US    Flu Vaccine   Genetic Screen  NIPS:   TDaP vaccine    Hgb A1C or  GTT Early : Third trimester :   Covid    LAB RESULTS   Rhogam   Blood Type O/Weak D/-- (11/09 1103)   Feeding Plan  Antibody Negative (11/09 1103)  Contraception  Rubella 4.83 (11/09 1103)  Circumcision  RPR Non Reactive (11/09 1103)   Pediatrician   HBsAg Negative (11/09 1103)   Support Person  HIV Non Reactive (11/09 1103)  Prenatal Classes  Varicella     GBS  (For PCN allergy, check sensitivities)   BTL Consent     VBAC Consent  Pap      Hgb Electro    Pelvis Tested  CF      SMA                   Preterm labor symptoms and general obstetric precautions including but not limited to vaginal bleeding, contractions, leaking of fluid and fetal movement were reviewed in detail with the patient. Please refer to After Visit Summary for  other counseling recommendations.   Return in about 4 weeks (around 06/01/2021) for ROB, may make an appt for next week for labs.   Thomasene Mohair, MD, [facility], [facility] 05/04/2021 1:42 PM

## 2021-05-11 ENCOUNTER — Other Ambulatory Visit: Payer: Self-pay

## 2021-05-11 ENCOUNTER — Other Ambulatory Visit: Payer: Managed Care, Other (non HMO)

## 2021-05-11 DIAGNOSIS — Z3A09 9 weeks gestation of pregnancy: Secondary | ICD-10-CM

## 2021-05-11 DIAGNOSIS — Z3481 Encounter for supervision of other normal pregnancy, first trimester: Secondary | ICD-10-CM

## 2021-05-11 DIAGNOSIS — O09299 Supervision of pregnancy with other poor reproductive or obstetric history, unspecified trimester: Secondary | ICD-10-CM

## 2021-05-11 DIAGNOSIS — Z1379 Encounter for other screening for genetic and chromosomal anomalies: Secondary | ICD-10-CM

## 2021-05-16 LAB — MATERNIT 21 PLUS CORE, BLOOD
Fetal Fraction: 6
Result (T21): NEGATIVE
Trisomy 13 (Patau syndrome): NEGATIVE
Trisomy 18 (Edwards syndrome): NEGATIVE
Trisomy 21 (Down syndrome): NEGATIVE

## 2021-05-21 ENCOUNTER — Other Ambulatory Visit: Payer: Self-pay

## 2021-05-21 ENCOUNTER — Ambulatory Visit
Admission: EM | Admit: 2021-05-21 | Discharge: 2021-05-21 | Disposition: A | Discharge status: Home / Self Care | Payer: Managed Care, Other (non HMO) | Attending: Family Medicine | Admitting: Family Medicine

## 2021-05-21 ENCOUNTER — Encounter: Payer: Self-pay | Admitting: Emergency Medicine

## 2021-05-21 DIAGNOSIS — N390 Urinary tract infection, site not specified: Secondary | ICD-10-CM | POA: Insufficient documentation

## 2021-05-21 DIAGNOSIS — R300 Dysuria: Secondary | ICD-10-CM

## 2021-05-21 LAB — POCT URINALYSIS DIP (MANUAL ENTRY)
Glucose, UA: 250 mg/dL — AB
Nitrite, UA: POSITIVE — AB
Protein Ur, POC: >=300 mg/dL — AB
Spec Grav, UA: <=1.005 — AB (ref 1.010–1.025)
Urobilinogen, UA: >=8 E.U./dL — AB
pH, UA: 5 (ref 5.0–8.0)

## 2021-05-21 MED ORDER — NITROFURANTOIN MONOHYD MACRO 100 MG PO CAPS
100.0000 mg | ORAL_CAPSULE | Freq: Two times a day (BID) | ORAL | 0 refills | Status: AC
Start: 2021-05-21 — End: 2021-05-28

## 2021-05-21 NOTE — ED Provider Notes (Signed)
Renaldo Fiddler    CSN: [id number] Arrival date & time: 05/21/21  1050      History   Chief Complaint Chief Complaint  Patient presents with   Back Pain   Dysuria    HPI Karen Woodward is a 20 y.o. female.   HPI Patient presents today, [redacted] weeks pregnant, with a complaint of back pain and dysuria times two days. Back pain is localized to the left lower back. She is afebrile.  Does not have a history of recurrent UTIs.  Past Medical History:  Diagnosis Date   Anemia    Migraine with aura    Morbid obesity with BMI of 40.0-44.9, adult [facility])     Patient Active Problem List   Diagnosis Date Noted   History of postpartum hemorrhage, currently pregnant 04/20/2021   Hx of preeclampsia, prior pregnancy, currently pregnant 04/20/2021   Supervision of other normal pregnancy, antepartum 04/20/2021   Menometrorrhagia 05/16/2018   Soft tissue swelling of back 05/16/2018   Dysmenorrhea 05/16/2018    Past Surgical History:  Procedure Laterality Date   TONSILLECTOMY AND ADENOIDECTOMY  06/2014    OB History     Gravida  3   Para  1   Term  1   Preterm  0   AB  1   Living  1      SAB  1   IAB  0   Ectopic  0   Multiple  0   Live Births  1            Home Medications    Prior to Admission medications   Medication Sig Start Date End Date Taking? Authorizing Provider  nitrofurantoin, macrocrystal-monohydrate, (MACROBID) 100 MG capsule Take 1 capsule (100 mg total) by mouth 2 (two) times daily for 7 days. 05/21/21 05/28/21 Yes Bing Neighbors, FNP  Prenatal Vit-Fe Fumarate-FA (MULTIVITAMIN-PRENATAL) 27-0.8 MG TABS tablet Take by mouth daily at 12 noon.   Yes [provider]  Doxylamine-Pyridoxine (DICLEGIS) 10-10 MG TBEC Take 2 tablets by mouth at bedtime. If symptoms persist, add one tablet in the morning and one in the afternoon 04/20/21   Vena Austria, MD  norethindrone (MICRONOR) 0.35 MG tablet Take 1 tablet (0.35 mg total) by  mouth daily. Patient not taking: Reported on 03/24/2021 03/07/21   Vena Austria, MD  ondansetron (ZOFRAN ODT) 4 MG disintegrating tablet Take 1 tablet (4 mg total) by mouth every 6 (six) hours as needed for nausea. 04/20/21   Vena Austria, MD    Family History Family History  Problem Relation Age of Onset   Hypertension Mother    Diabetes Maternal Grandfather     Social History Social History   Tobacco Use   Smoking status: Never   Smokeless tobacco: Never  Vaping Use   Vaping Use: Never used  Substance Use Topics   Alcohol use: No   Drug use: No     Allergies   Patient has no known allergies.   Review of Systems Review of Systems Pertinent negatives listed in HPI  Physical Exam Triage Vital Signs ED Triage Vitals  Enc Vitals Group     BP      Pulse      Resp      Temp      Temp src      SpO2      Weight      Height      Head Circumference      Peak Flow  Pain Score      Pain Loc      Pain Edu?      Excl. in GC?    No data found.  Updated Vital Signs BP 132/87 (BP Location: Left Wrist)   Pulse (!) 103   Temp 98.2 F (36.8 C) (Oral)   Resp 18   LMP 02/27/2021   SpO2 96%   Visual Acuity Right Eye Distance:   Left Eye Distance:   Bilateral Distance:    Right Eye Near:   Left Eye Near:    Bilateral Near:     Physical Exam General appearance: Alert, well developed, well nourished, cooperative  Head: Normocephalic, without obvious abnormality, atraumatic Respiratory: Respirations even and unlabored, normal respiratory rate Heart: Rate and rhythm normal.   Abdomen: No CVA tenderness Extremities: No gross deformities Skin: Skin color, texture, turgor normal. No rashes seen  Psych: Appropriate mood and affect. Neurologic: No focal neurological abnormalities. UC Treatments / Results  Labs (all labs ordered are listed, but only abnormal results are displayed) Labs Reviewed  POCT URINALYSIS DIP (MANUAL ENTRY) - Abnormal; Notable  for the following components:      Result Value   Color, UA orange (*)    Clarity, UA cloudy (*)    Glucose, UA =250 (*)    Bilirubin, UA moderate (*)    Ketones, POC UA moderate (40) (*)    Spec Grav, UA <=1.005 (*)    Blood, UA moderate (*)    Protein Ur, POC >=300 (*)    Urobilinogen, UA >=8.0 (*)    Nitrite, UA Positive (*)    Leukocytes, UA Large (3+) (*)    All other components within normal limits  URINE CULTURE    EKG   Radiology No results found.  Procedures Procedures (including critical care time)  Medications Ordered in UC Medications - No data to display  Initial Impression / Assessment and Plan / UC Course  I have reviewed the triage vital signs and the nursing notes.  Pertinent labs & imaging results that were available during my care of the patient were reviewed by me and considered in my medical decision making (see chart for details).    Acute UTI, treating with Macrobid twice daily for 7 days. Urine culture pending.  Hydrate well with fluids. Symptoms worsen or do not readily improve, go immediately to the ER, Final Clinical Impressions(s) / UC Diagnoses   Final diagnoses:  Acute urinary tract infection     Discharge Instructions      Your urine culture is pending.  I am treating for urinary tract infection with nitrofurantoin taken twice a day for the next 7 days.  For back pain you can take Tylenol.  Drink plenty of fluids while taking medication.     ED Prescriptions     Medication Sig Dispense Auth. Provider   nitrofurantoin, macrocrystal-monohydrate, (MACROBID) 100 MG capsule Take 1 capsule (100 mg total) by mouth 2 (two) times daily for 7 days. 14 capsule Bing Neighbors, FNP      PDMP not reviewed this encounter.   Bing Neighbors, FNP 05/21/21 1134

## 2021-05-21 NOTE — ED Triage Notes (Signed)
Pt c/o feeling achy especially in her back and dysuria since yesterday.

## 2021-05-21 NOTE — Discharge Instructions (Signed)
Your urine culture is pending.  I am treating for urinary tract infection with nitrofurantoin taken twice a day for the next 7 days.  For back pain you can take Tylenol.  Drink plenty of fluids while taking medication.

## 2021-05-23 ENCOUNTER — Encounter: Payer: Self-pay | Admitting: Obstetrics and Gynecology

## 2021-05-24 LAB — URINE CULTURE: Culture: >=100000 — AB

## 2021-05-31 ENCOUNTER — Ambulatory Visit (INDEPENDENT_AMBULATORY_CARE_PROVIDER_SITE_OTHER): Payer: Managed Care, Other (non HMO) | Admitting: Obstetrics and Gynecology

## 2021-05-31 ENCOUNTER — Other Ambulatory Visit: Payer: Self-pay

## 2021-05-31 VITALS — BP 120/78 | Wt 191.0 lb

## 2021-05-31 DIAGNOSIS — Z3A13 13 weeks gestation of pregnancy: Secondary | ICD-10-CM

## 2021-05-31 DIAGNOSIS — O09299 Supervision of pregnancy with other poor reproductive or obstetric history, unspecified trimester: Secondary | ICD-10-CM

## 2021-05-31 DIAGNOSIS — Z3480 Encounter for supervision of other normal pregnancy, unspecified trimester: Secondary | ICD-10-CM

## 2021-05-31 DIAGNOSIS — Z363 Encounter for antenatal screening for malformations: Secondary | ICD-10-CM

## 2021-05-31 LAB — POCT URINALYSIS DIPSTICK OB
Glucose, UA: NEGATIVE
POC,PROTEIN,UA: NEGATIVE

## 2021-05-31 NOTE — Progress Notes (Signed)
ROB - no concerns. RM 4 °

## 2021-05-31 NOTE — Progress Notes (Signed)
Routine Prenatal Care Visit  Subjective  Karen Woodward is a 20 y.o. G3P1011 at [redacted]w[redacted]d being seen today for ongoing prenatal care.  She is currently monitored for the following issues for this low-risk pregnancy and has Menometrorrhagia; Soft tissue swelling of back; Dysmenorrhea; History of postpartum hemorrhage, currently pregnant; Hx of preeclampsia, prior pregnancy, currently pregnant; and Supervision of other normal pregnancy, antepartum on their problem list.  ----------------------------------------------------------------------------------- Patient reports no complaints.   Contractions: Not present. Vag. Bleeding: None.  Movement: Absent. Denies leaking of fluid.  ----------------------------------------------------------------------------------- The following portions of the patient's history were reviewed and updated as appropriate: allergies, current medications, past family history, past medical history, past social history, past surgical history and problem list. Problem list updated.   Objective  Blood pressure 120/78, weight 191 lb (86.6 kg), last menstrual period 02/27/2021, not currently breastfeeding. Pregravid weight 195 lb (88.5 kg) Total Weight Gain -4 lb (-1.814 kg) Urinalysis:      Fetal Status: Fetal Heart Rate (bpm): 150   Movement: Absent     General:  Alert, oriented and cooperative. Patient is in no acute distress.  Skin: Skin is warm and dry. No rash noted.   Cardiovascular: Normal heart rate noted  Respiratory: Normal respiratory effort, no problems with respiration noted  Abdomen: Soft, gravid, appropriate for gestational age. Pain/Pressure: Absent     Pelvic:  Cervical exam deferred        Extremities: Normal range of motion.     ental Status: Normal mood and affect. Normal behavior. Normal judgment and thought content.     Assessment   20 y.o. G3P1011 at [redacted]w[redacted]d by  12/04/2021, by Last Menstrual Period presenting for routine prenatal visit  Plan    Pregnancy #3 Problems (from 04/20/21 to present)     Problem Noted Resolved   History of postpartum hemorrhage, currently pregnant 04/20/2021 by Vena Austria, MD No   Overview Signed 05/04/2021  1:43 PM by Conard Novak, MD    Received 2 units pRBCs      Hx of preeclampsia, prior pregnancy, currently pregnant 04/20/2021 by Vena Austria, MD No   Supervision of other normal pregnancy, antepartum 04/20/2021 by Vena Austria, MD No   Overview Signed 05/04/2021  1:44 PM by Conard Novak, MD     Nursing Staff Provider  Office Location  [facility] Dating  L=9wks  Language  English Anatomy US    Flu Vaccine   Genetic Screen  NIPS:   TDaP vaccine    Hgb A1C or  GTT Early : Third trimester :   Covid    LAB RESULTS   Rhogam   Blood Type O/Weak D/-- (11/09 1103)   Feeding Plan  Antibody Negative (11/09 1103)  Contraception  Rubella 4.83 (11/09 1103)  Circumcision  RPR Non Reactive (11/09 1103)   Pediatrician   HBsAg Negative (11/09 1103)   Support Person  HIV Non Reactive (11/09 1103)  Prenatal Classes  Varicella     GBS  (For PCN allergy, check sensitivities)   BTL Consent     VBAC Consent  Pap      Hgb Electro    Pelvis Tested  CF      SMA                    Gestational age appropriate obstetric precautions including but not limited to vaginal bleeding, contractions, leaking of fluid and fetal movement were reviewed in detail with the patient.    - anatomy scan  ordered  Return in about 4 weeks (around 06/28/2021) for ROB, anatomy scan 6 weeks [facility].  Vena Austria, MD, [facility], [facility] 05/31/2021, 11:56 AM

## 2021-06-08 ENCOUNTER — Encounter: Payer: Self-pay | Admitting: Obstetrics and Gynecology

## 2021-06-12 NOTE — L&D Delivery Note (Signed)
Delivery Note Karen Woodward is a 21 y.o. G3P1011 at [redacted]w[redacted]d admitted for active labor.   GBS Status: NEGATIVE/-- (06/02 0114) Maximum Maternal Temperature: 98.0  Labor course: Initial SVE: 5/90-3. Augmentation with: AROM. Brisk amount of bright red blood at delivery, suspicious for possible abruption. She then progressed to complete.  ROM: 0h 74m with clear fluid  Birth: At [phone] a viable female was delivered via spontaneous vaginal delivery (Presentation:cephalic;LOA). Nuchal cord present: No.  Shoulders and body delivered in usual fashion. Infant placed directly on mom's abdomen for bonding/skin-to-skin, baby dried and stimulated. Cord clamped x 2 after 1 minute and cut by FOB-Karen Woodward.  Cord blood collected. The placenta immediately separated spontaneously Karen Woodward) and delivered via gentle cord traction.  Pitocin infused rapidly IV per protocol.  Given history of PPH TXA was also ordered prophylactically. Fundus firm with massage.  Placenta inspected and appears to be intact with a 3 VC.  Placenta/Cord with the following complications: suspected abruption.  Cord pH: none Sponge and instrument count were correct x2.  Intrapartum complications:  None Anesthesia:  epidural Episiotomy: none Lacerations:  none Suture Repair:  n/a EBL (mL): 300   Infant: APGAR (1 MIN):  8 APGAR (5 MINS):  9 Infant weight: pending  Mom to postpartum.  Baby to Couplet care / Skin to Skin. Placenta to Pathology for [redacted] weeks gestation and possible abruption    Plans to Bottlefeed Contraception: condoms and vasectomy Circumcision: declines  Note sent to [facility]: [facility] for pp visit.   Renee Harder CNM 11/11/2021 9:56 AM

## 2021-06-28 ENCOUNTER — Encounter: Payer: Managed Care, Other (non HMO) | Admitting: Obstetrics

## 2021-06-30 ENCOUNTER — Ambulatory Visit: Payer: Managed Care, Other (non HMO)

## 2021-07-01 ENCOUNTER — Encounter: Payer: Managed Care, Other (non HMO) | Admitting: Licensed Practical Nurse

## 2021-07-08 ENCOUNTER — Other Ambulatory Visit: Payer: Self-pay

## 2021-07-08 ENCOUNTER — Ambulatory Visit
Admission: RE | Admit: 2021-07-08 | Discharge: 2021-07-08 | Disposition: A | Discharge status: Home / Self Care | Payer: Managed Care, Other (non HMO) | Source: Ambulatory Visit | Attending: Obstetrics and Gynecology | Admitting: Obstetrics and Gynecology

## 2021-07-08 DIAGNOSIS — Z3480 Encounter for supervision of other normal pregnancy, unspecified trimester: Secondary | ICD-10-CM

## 2021-07-08 DIAGNOSIS — Z363 Encounter for antenatal screening for malformations: Secondary | ICD-10-CM | POA: Insufficient documentation

## 2021-07-08 DIAGNOSIS — Z3A19 19 weeks gestation of pregnancy: Secondary | ICD-10-CM | POA: Diagnosis not present

## 2021-07-11 ENCOUNTER — Encounter: Payer: Managed Care, Other (non HMO) | Admitting: Obstetrics

## 2021-07-12 ENCOUNTER — Encounter: Payer: Managed Care, Other (non HMO) | Admitting: Obstetrics & Gynecology

## 2021-07-27 ENCOUNTER — Encounter: Payer: Self-pay | Admitting: Obstetrics and Gynecology

## 2021-07-27 ENCOUNTER — Ambulatory Visit (INDEPENDENT_AMBULATORY_CARE_PROVIDER_SITE_OTHER): Payer: Managed Care, Other (non HMO) | Admitting: Obstetrics and Gynecology

## 2021-07-27 ENCOUNTER — Other Ambulatory Visit: Payer: Self-pay

## 2021-07-27 VITALS — BP 123/78 | HR 87 | Wt 195.0 lb

## 2021-07-27 DIAGNOSIS — O26899 Other specified pregnancy related conditions, unspecified trimester: Secondary | ICD-10-CM

## 2021-07-27 DIAGNOSIS — R7401 Elevation of levels of liver transaminase levels: Secondary | ICD-10-CM | POA: Insufficient documentation

## 2021-07-27 DIAGNOSIS — O09299 Supervision of pregnancy with other poor reproductive or obstetric history, unspecified trimester: Secondary | ICD-10-CM

## 2021-07-27 DIAGNOSIS — O2342 Unspecified infection of urinary tract in pregnancy, second trimester: Secondary | ICD-10-CM | POA: Insufficient documentation

## 2021-07-27 DIAGNOSIS — Z3480 Encounter for supervision of other normal pregnancy, unspecified trimester: Secondary | ICD-10-CM

## 2021-07-27 DIAGNOSIS — Z3A21 21 weeks gestation of pregnancy: Secondary | ICD-10-CM

## 2021-07-27 DIAGNOSIS — Z6791 Unspecified blood type, Rh negative: Secondary | ICD-10-CM

## 2021-07-27 NOTE — Progress Notes (Signed)
Last pap- 03/28/21- negative PHQ 9 Score: 2 GAD 7 Score: 0

## 2021-07-27 NOTE — Progress Notes (Signed)
° °  PRENATAL VISIT NOTE  Patient desired transfer of care from [place]:  Karen Woodward is a 21 y.o. G3P1011 at [redacted]w[redacted]d being seen today for ongoing prenatal care.  She is currently monitored for the following issues for this high-risk pregnancy and has Menometrorrhagia; Soft tissue swelling of back; Dysmenorrhea; History of postpartum hemorrhage, currently pregnant; Hx of preeclampsia, prior pregnancy, currently pregnant; Supervision of other normal pregnancy, antepartum; ALT (SGPT) level raised; Urinary tract infection in mother during second trimester of pregnancy; and Rh negative state in antepartum period on their problem list.  Patient reports no complaints.  Contractions: Not present. Vag. Bleeding: None.  Movement: Present. Denies leaking of fluid.   The following portions of the patient's history were reviewed and updated as appropriate: allergies, current medications, past family history, past medical history, past social history, past surgical history and problem list.   Objective:   Vitals:   07/27/21 0956  BP: 123/78  Pulse: 87  Weight: 195 lb (88.5 kg)    Fetal Status: Fetal Heart Rate (bpm): 134   Movement: Present     General:  Alert, oriented and cooperative. Patient is in no acute distress.  Skin: Skin is warm and dry. No rash noted.   Cardiovascular: Normal heart rate noted  Respiratory: Normal respiratory effort, no problems with respiration noted  Abdomen: Soft, gravid, appropriate for gestational age.  Pain/Pressure: Absent     Pelvic: Cervical exam deferred        Extremities: Normal range of motion.  Edema: None  Mental Status: Normal mood and affect. Normal behavior. Normal judgment and thought content.   Assessment and Plan:  Pregnancy: G3P1011 at [redacted]w[redacted]d 1. Supervision of other normal pregnancy, antepartum Routine care. Anatomy u/s wnl  2. Hx of preeclampsia, prior pregnancy, currently pregnant D/w pt re: starting low dose ASA and pt  amenable to this.   3. History of postpartum hemorrhage, currently pregnant Due to atony. Recommend active management of 3rd stage  4. ALT (SGPT) level raised Repeat cmp with 28wk labs  5. Urinary tract infection in mother during second trimester of pregnancy Toc today - Culture, OB Urine  6. [redacted] weeks gestation of pregnancy  7. Rh negative state in antepartum period Rhogam and ab screen at 28wks  Preterm labor symptoms and general obstetric precautions including but not limited to vaginal bleeding, contractions, leaking of fluid and fetal movement were reviewed in detail with the patient. Please refer to After Visit Summary for other counseling recommendations.   Return in about 4 weeks (around 08/24/2021) for md or app, low risk ob, in person.  Future Appointments  Date Time Provider Department Center  08/24/2021 10:35 AM Donnamae Jude, MD [facility]  09/14/2021  8:55 AM Aletha Halim, MD [facility]  09/28/2021  9:35 AM Aletha Halim, MD [facility]    Aletha Halim, MD

## 2021-07-27 NOTE — Progress Notes (Signed)
Pt had headache for 3 days last week with visual changes. Pt denies current headache and/or visual changes.

## 2021-07-28 MED ORDER — ASPIRIN EC 81 MG PO TBEC: 81.0000 mg | DELAYED_RELEASE_TABLET | Freq: Every day | ORAL | 1 refills | Status: DC

## 2021-07-29 LAB — CULTURE, OB URINE

## 2021-07-29 LAB — URINE CULTURE, OB REFLEX

## 2021-08-16 ENCOUNTER — Encounter: Payer: Self-pay | Admitting: Obstetrics and Gynecology

## 2021-08-16 ENCOUNTER — Other Ambulatory Visit: Payer: Self-pay | Admitting: *Deleted

## 2021-08-16 DIAGNOSIS — Z3480 Encounter for supervision of other normal pregnancy, unspecified trimester: Secondary | ICD-10-CM

## 2021-08-16 DIAGNOSIS — R42 Dizziness and giddiness: Secondary | ICD-10-CM

## 2021-08-17 ENCOUNTER — Other Ambulatory Visit: Payer: Self-pay

## 2021-08-17 ENCOUNTER — Other Ambulatory Visit: Payer: Managed Care, Other (non HMO)

## 2021-08-17 DIAGNOSIS — Z3480 Encounter for supervision of other normal pregnancy, unspecified trimester: Secondary | ICD-10-CM

## 2021-08-17 DIAGNOSIS — R42 Dizziness and giddiness: Secondary | ICD-10-CM

## 2021-08-18 LAB — COMPREHENSIVE METABOLIC PANEL
ALT: 12 IU/L (ref 0–32)
AST: 15 IU/L (ref 0–40)
Albumin/Globulin Ratio: 1.5 (ref 1.2–2.2)
Albumin: 3.8 g/dL — ABNORMAL LOW (ref 3.9–5.0)
Alkaline Phosphatase: 106 IU/L (ref 44–121)
BUN/Creatinine Ratio: 18 (ref 9–23)
BUN: 7 mg/dL (ref 6–20)
Bilirubin Total: <0.2 mg/dL (ref 0.0–1.2)
CO2: 18 mmol/L — ABNORMAL LOW (ref 20–29)
Calcium: 9.2 mg/dL (ref 8.7–10.2)
Chloride: 102 mmol/L (ref 96–106)
Creatinine, Ser: 0.39 mg/dL — ABNORMAL LOW (ref 0.57–1.00)
Globulin, Total: 2.6 g/dL (ref 1.5–4.5)
Glucose: 68 mg/dL — ABNORMAL LOW (ref 70–99)
Potassium: 4.1 mmol/L (ref 3.5–5.2)
Sodium: 135 mmol/L (ref 134–144)
Total Protein: 6.4 g/dL (ref 6.0–8.5)
eGFR: 144 mL/min/{1.73_m2} (ref >59)

## 2021-08-18 LAB — CBC
Hematocrit: 34.9 % (ref 34.0–46.6)
Hemoglobin: 11.5 g/dL (ref 11.1–15.9)
MCH: 26 pg — ABNORMAL LOW (ref 26.6–33.0)
MCHC: 33 g/dL (ref 31.5–35.7)
MCV: 79 fL (ref 79–97)
Platelets: 365 10*3/uL (ref 150–450)
RBC: 4.42 x10E6/uL (ref 3.77–5.28)
RDW: 15 % (ref 11.7–15.4)
WBC: 9.3 10*3/uL (ref 3.4–10.8)

## 2021-08-18 LAB — TSH: TSH: 1.71 u[IU]/mL (ref 0.450–4.500)

## 2021-08-18 LAB — MAGNESIUM: Magnesium: 1.9 mg/dL (ref 1.6–2.3)

## 2021-08-24 ENCOUNTER — Encounter: Payer: Self-pay | Admitting: Family Medicine

## 2021-08-24 ENCOUNTER — Other Ambulatory Visit: Payer: Self-pay

## 2021-08-24 ENCOUNTER — Ambulatory Visit (INDEPENDENT_AMBULATORY_CARE_PROVIDER_SITE_OTHER): Payer: Managed Care, Other (non HMO) | Admitting: Family Medicine

## 2021-08-24 VITALS — BP 119/77 | HR 96 | Wt 206.2 lb

## 2021-08-24 DIAGNOSIS — O09299 Supervision of pregnancy with other poor reproductive or obstetric history, unspecified trimester: Secondary | ICD-10-CM

## 2021-08-24 DIAGNOSIS — N921 Excessive and frequent menstruation with irregular cycle: Secondary | ICD-10-CM

## 2021-08-24 DIAGNOSIS — O26899 Other specified pregnancy related conditions, unspecified trimester: Secondary | ICD-10-CM

## 2021-08-24 DIAGNOSIS — Z6791 Unspecified blood type, Rh negative: Secondary | ICD-10-CM

## 2021-08-24 DIAGNOSIS — Z3480 Encounter for supervision of other normal pregnancy, unspecified trimester: Secondary | ICD-10-CM

## 2021-08-24 MED ORDER — ONDANSETRON 4 MG PO TBDP: 4.0000 mg | ORAL_TABLET | Freq: Four times a day (QID) | ORAL | 0 refills | Status: DC | PRN

## 2021-08-24 NOTE — Progress Notes (Signed)
? ?  PRENATAL VISIT NOTE ? ?Subjective:  ?Karen Woodward is a 21 y.o. G3P1011 at [redacted]w[redacted]d being seen today for ongoing prenatal care.  She is currently monitored for the following issues for this low-risk pregnancy and has Menometrorrhagia; Soft tissue swelling of back; Dysmenorrhea; History of postpartum hemorrhage, currently pregnant; Hx of preeclampsia, prior pregnancy, currently pregnant; Supervision of other normal pregnancy, antepartum; ALT (SGPT) level raised; Urinary tract infection in mother during second trimester of pregnancy; and Rh negative state in antepartum period on their problem list. ? ?Patient reports no complaints.  Contractions: Irritability. Vag. Bleeding: None.  Movement: Present. Denies leaking of fluid.  ? ?The following portions of the patient's history were reviewed and updated as appropriate: allergies, current medications, past family history, past medical history, past social history, past surgical history and problem list.  ? ?Objective:  ? ?Vitals:  ? 08/24/21 1102  ?BP: 119/77  ?Pulse: 96  ?Weight: 206 lb 3.2 oz (93.5 kg)  ? ? ?Fetal Status: Fetal Heart Rate (bpm): 141 Fundal Height: 25 cm Movement: Present    ? ?General:  Alert, oriented and cooperative. Patient is in no acute distress.  ?Skin: Skin is warm and dry. No rash noted.   ?Cardiovascular: Normal heart rate noted  ?Respiratory: Normal respiratory effort, no problems with respiration noted  ?Abdomen: Soft, gravid, appropriate for gestational age.  Pain/Pressure: Absent     ?Pelvic: Cervical exam deferred        ?Extremities: Normal range of motion.  Edema: Trace  ?Mental Status: Normal mood and affect. Normal behavior. Normal judgment and thought content.  ? ?Assessment and Plan:  ?Pregnancy: G3P1011 at [redacted]w[redacted]d ?1. Supervision of other normal pregnancy, antepartum ?Continue routine prenatal care. ?28 wk labs next visit ? ?2. Hx of preeclampsia, prior pregnancy, currently pregnant ?On ASA ? ?3. Rh negative state in antepartum  period ?Will need rhogam at next visit. ? ?Preterm labor symptoms and general obstetric precautions including but not limited to vaginal bleeding, contractions, leaking of fluid and fetal movement were reviewed in detail with the patient. ?Please refer to After Visit Summary for other counseling recommendations.  ? ?Return in 3 weeks (on 09/14/2021) for 28 wk labs. ? ?Future Appointments  ?Date Time Provider Caroline  ?08/26/2021  4:20 PM Tobb, Godfrey Pick, DO [facility] None  ?09/14/2021  8:55 AM Aletha Halim, MD [facility]  ?09/28/2021  9:35 AM Aletha Halim, MD [facility]  ? ? ?Donnamae Jude, MD ? ?

## 2021-08-26 ENCOUNTER — Ambulatory Visit (INDEPENDENT_AMBULATORY_CARE_PROVIDER_SITE_OTHER): Payer: Managed Care, Other (non HMO) | Admitting: Cardiology

## 2021-08-26 ENCOUNTER — Other Ambulatory Visit: Payer: Self-pay

## 2021-08-26 ENCOUNTER — Encounter: Payer: Self-pay | Admitting: Cardiology

## 2021-08-26 ENCOUNTER — Telehealth: Payer: Self-pay

## 2021-08-26 VITALS — BP 126/86 | HR 90 | Ht 63.0 in | Wt 206.9 lb

## 2021-08-26 DIAGNOSIS — R0602 Shortness of breath: Secondary | ICD-10-CM

## 2021-08-26 DIAGNOSIS — R002 Palpitations: Secondary | ICD-10-CM | POA: Diagnosis not present

## 2021-08-26 MED ORDER — PROPRANOLOL HCL 10 MG PO TABS: 10.0000 mg | ORAL_TABLET | Freq: Every evening | ORAL | 3 refills | Status: DC

## 2021-08-26 NOTE — Patient Instructions (Addendum)
Medication Instructions:  ?Your physician has recommended you make the following change in your medication:  ?START: Propranolol 10 mg at night ?*If you need a refill on your cardiac medications before your next appointment, please call your pharmacy* ? ? ?Lab Work: ?None ?If you have labs (blood work) drawn today and your tests are completely normal, you will receive your results only by: ?MyChart Message (if you have MyChart) OR ?A paper copy in the mail ?If you have any lab test that is abnormal or we need to change your treatment, we will call you to review the results. ? ? ?Testing/Procedures: ?None ? ? ? ?Follow-Up: ?At [facility], you and your health needs are our priority.  As part of our continuing mission to provide you with exceptional heart care, we have created designated Provider Care Teams.  These Care Teams include your primary Cardiologist (physician) and Advanced Practice Providers (APPs -  Physician Assistants and Nurse Practitioners) who all work together to provide you with the care you need, when you need it. ? ?We recommend signing up for the patient portal called "MyChart".  Sign up information is provided on this After Visit Summary.  MyChart is used to connect with patients for Virtual Visits (Telemedicine).  Patients are able to view lab/test results, encounter notes, upcoming appointments, etc.  Non-urgent messages can be sent to your provider as well.   ?To learn more about what you can do with MyChart, go to [email or web address].   ? ?Your next appointment:   ?April 13th at 10am  ? ?The format for your next appointment:   ?Virtual Visit  ? ?Provider:   ?Berniece Salines, DO   ? ? ?Other Instructions ?  ?
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
## 2021-08-26 NOTE — Telephone Encounter (Signed)
Called pt to go over her AVS and schedule her follow up. No further question at this time.  ?

## 2021-08-29 ENCOUNTER — Encounter: Payer: Self-pay | Admitting: Obstetrics and Gynecology

## 2021-09-14 ENCOUNTER — Ambulatory Visit (INDEPENDENT_AMBULATORY_CARE_PROVIDER_SITE_OTHER): Payer: Managed Care, Other (non HMO) | Admitting: Obstetrics and Gynecology

## 2021-09-14 VITALS — BP 118/79 | HR 79 | Wt 211.2 lb

## 2021-09-14 DIAGNOSIS — Z3A28 28 weeks gestation of pregnancy: Secondary | ICD-10-CM

## 2021-09-14 DIAGNOSIS — O2342 Unspecified infection of urinary tract in pregnancy, second trimester: Secondary | ICD-10-CM

## 2021-09-14 DIAGNOSIS — O26899 Other specified pregnancy related conditions, unspecified trimester: Secondary | ICD-10-CM

## 2021-09-14 DIAGNOSIS — Z6791 Unspecified blood type, Rh negative: Secondary | ICD-10-CM

## 2021-09-14 DIAGNOSIS — O09299 Supervision of pregnancy with other poor reproductive or obstetric history, unspecified trimester: Secondary | ICD-10-CM

## 2021-09-14 DIAGNOSIS — Z3480 Encounter for supervision of other normal pregnancy, unspecified trimester: Secondary | ICD-10-CM

## 2021-09-14 MED ORDER — RHO D IMMUNE GLOBULIN 1500 UNIT/2ML IJ SOSY
300.0000 ug | PREFILLED_SYRINGE | Freq: Once | INTRAMUSCULAR | Status: AC
  Administered 2021-09-14: 300 ug via INTRAMUSCULAR

## 2021-09-14 NOTE — Progress Notes (Signed)
Declined tdap

## 2021-09-14 NOTE — Progress Notes (Signed)
? ?  PRENATAL VISIT NOTE ? ?Subjective:  ?Karen Woodward is a 21 y.o. G3P1011 at [redacted]w[redacted]d being seen today for ongoing prenatal care.  She is currently monitored for the following issues for this low-risk pregnancy and has Menometrorrhagia; Soft tissue swelling of back; Dysmenorrhea; History of postpartum hemorrhage, currently pregnant; Hx of preeclampsia, prior pregnancy, currently pregnant; Supervision of other normal pregnancy, antepartum; Urinary tract infection in mother during second trimester of pregnancy; and Rh negative state in antepartum period on their problem list. ? ?Patient reports no complaints.  Contractions: Irritability. Vag. Bleeding: None.  Movement: Present. Denies leaking of fluid.  ? ?The following portions of the patient's history were reviewed and updated as appropriate: allergies, current medications, past family history, past medical history, past social history, past surgical history and problem list.  ? ?Objective:  ? ?Vitals:  ? 09/14/21 0908  ?BP: 118/79  ?Pulse: 79  ?Weight: 211 lb 3.2 oz (95.8 kg)  ? ? ?Fetal Status: Fetal Heart Rate (bpm): 148 Fundal Height: 28 cm Movement: Present    ? ?General:  Alert, oriented and cooperative. Patient is in no acute distress.  ?Skin: Skin is warm and dry. No rash noted.   ?Cardiovascular: Normal heart rate noted  ?Respiratory: Normal respiratory effort, no problems with respiration noted  ?Abdomen: Soft, gravid, appropriate for gestational age.  Pain/Pressure: Absent     ?Pelvic: Cervical exam deferred        ?Extremities: Normal range of motion.  Edema: Trace  ?Mental Status: Normal mood and affect. Normal behavior. Normal judgment and thought content.  ? ?Assessment and Plan:  ?Pregnancy: G3P1011 at [redacted]w[redacted]d ?1. Supervision of other normal pregnancy, antepartum ?Routine care.  ? ?S/p cards consult for dizzy and palps. Pt on propranolol before for dizzy and palps and it made her feel bad so pt holding off. Recommend hydration and adding kosher/pink  salt/pregnancy belt to foods to see if that can help ?- Glucose Tolerance, 2 Hours w/1 Hour ?- CBC ?- RPR ?- HIV Antibody (routine testing w rflx) ?- Antibody screen ?- rho (d) immune globulin (RHIG/RHOPHYLAC) injection 300 mcg ? ?2. Rh negative state in antepartum period ?And rhogam today ?- Antibody screen ? ?3. [redacted] weeks gestation of pregnancy ? ?4. Hx of preeclampsia, prior pregnancy, currently pregnant ?Continue low dose asa ? ?5. Urinary tract infection in mother during second trimester of pregnancy ?Toc neg ? ?6. History of postpartum hemorrhage, currently pregnant ?2/2 atony ? ?Preterm labor symptoms and general obstetric precautions including but not limited to vaginal bleeding, contractions, leaking of fluid and fetal movement were reviewed in detail with the patient. ?Please refer to After Visit Summary for other counseling recommendations.  ? ?Return in about 2 weeks (around 09/28/2021) for md or app, in person or virtual, low risk ob. ? ?Future Appointments  ?Date Time Provider Department Center  ?09/22/2021 10:00 AM Tobb, [place]ona [place], DO [facility]  ?09/28/2021  9:35 AM [place] Bing, MD [facility]  ? ? ? Bing, MD ? ?

## 2021-09-15 LAB — CBC
Hematocrit: 33.9 % — ABNORMAL LOW (ref 34.0–46.6)
Hemoglobin: 11.3 g/dL (ref 11.1–15.9)
MCH: 27.4 pg (ref 26.6–33.0)
MCHC: 33.3 g/dL (ref 31.5–35.7)
MCV: 82 fL (ref 79–97)
Platelets: 286 10*3/uL (ref 150–450)
RBC: 4.13 x10E6/uL (ref 3.77–5.28)
RDW: 15.8 % — ABNORMAL HIGH (ref 11.7–15.4)
WBC: 9.5 10*3/uL (ref 3.4–10.8)

## 2021-09-15 LAB — HIV ANTIBODY (ROUTINE TESTING W REFLEX): HIV Screen 4th Generation wRfx: NONREACTIVE

## 2021-09-15 LAB — GLUCOSE TOLERANCE, 2 HOURS W/ 1HR
Glucose, 1 hour: 110 mg/dL (ref 70–179)
Glucose, 2 hour: 99 mg/dL (ref 70–152)
Glucose, Fasting: 71 mg/dL (ref 70–91)

## 2021-09-15 LAB — ANTIBODY SCREEN: Antibody Screen: NEGATIVE

## 2021-09-15 LAB — RPR: RPR Ser Ql: NONREACTIVE

## 2021-09-22 ENCOUNTER — Telehealth: Payer: Self-pay

## 2021-09-22 ENCOUNTER — Telehealth: Payer: Managed Care, Other (non HMO) | Admitting: Cardiology

## 2021-09-22 NOTE — Telephone Encounter (Signed)
Called pt to get her ready for her appt with Dr. Tobb. No answer, voicemail not set up. Unable to leave message.  ?

## 2021-09-22 NOTE — Telephone Encounter (Signed)
Called pt to get her ready for her appt with Dr. Servando Salina. No answer, voicemail not set up. Unable to leave message.  ?

## 2021-09-23 ENCOUNTER — Encounter: Payer: Self-pay | Admitting: Obstetrics and Gynecology

## 2021-09-26 ENCOUNTER — Other Ambulatory Visit: Payer: Self-pay

## 2021-09-26 ENCOUNTER — Inpatient Hospital Stay (HOSPITAL_BASED_OUTPATIENT_CLINIC_OR_DEPARTMENT_OTHER): Payer: Medicaid Other

## 2021-09-26 ENCOUNTER — Observation Stay (HOSPITAL_COMMUNITY)
Admission: AD | Admit: 2021-09-26 | Discharge: 2021-09-27 | Disposition: A | Discharge status: Home / Self Care | Payer: Medicaid Other | Attending: Obstetrics and Gynecology | Admitting: Obstetrics and Gynecology

## 2021-09-26 ENCOUNTER — Encounter (HOSPITAL_COMMUNITY): Payer: Self-pay | Admitting: Family Medicine

## 2021-09-26 DIAGNOSIS — Z7982 Long term (current) use of aspirin: Secondary | ICD-10-CM | POA: Diagnosis not present

## 2021-09-26 DIAGNOSIS — Z3A30 30 weeks gestation of pregnancy: Secondary | ICD-10-CM | POA: Diagnosis not present

## 2021-09-26 DIAGNOSIS — O9A213 Injury, poisoning and certain other consequences of external causes complicating pregnancy, third trimester: Principal | ICD-10-CM | POA: Insufficient documentation

## 2021-09-26 DIAGNOSIS — T1490XA Injury, unspecified, initial encounter: Secondary | ICD-10-CM

## 2021-09-26 DIAGNOSIS — O9A219 Injury, poisoning and certain other consequences of external causes complicating pregnancy, unspecified trimester: Secondary | ICD-10-CM

## 2021-09-26 DIAGNOSIS — O09893 Supervision of other high risk pregnancies, third trimester: Secondary | ICD-10-CM | POA: Diagnosis not present

## 2021-09-26 DIAGNOSIS — O321XX0 Maternal care for breech presentation, not applicable or unspecified: Secondary | ICD-10-CM

## 2021-09-26 LAB — CBC
HCT: 34.2 % — ABNORMAL LOW (ref 36.0–46.0)
Hemoglobin: 11.3 g/dL — ABNORMAL LOW (ref 12.0–15.0)
MCH: 27.6 pg (ref 26.0–34.0)
MCHC: 33 g/dL (ref 30.0–36.0)
MCV: 83.4 fL (ref 80.0–100.0)
Platelets: 293 10*3/uL (ref 150–400)
RBC: 4.1 MIL/uL (ref 3.87–5.11)
RDW: 16 % — ABNORMAL HIGH (ref 11.5–15.5)
WBC: 12.2 10*3/uL — ABNORMAL HIGH (ref 4.0–10.5)
nRBC: 0 % (ref 0.0–0.2)

## 2021-09-26 LAB — URINALYSIS, ROUTINE W REFLEX MICROSCOPIC
Bilirubin Urine: NEGATIVE
Glucose, UA: NEGATIVE mg/dL
Ketones, ur: NEGATIVE mg/dL
Nitrite: NEGATIVE
Protein, ur: NEGATIVE mg/dL
Specific Gravity, Urine: 1.01 (ref 1.005–1.030)
pH: 8 (ref 5.0–8.0)

## 2021-09-26 MED ORDER — CYCLOBENZAPRINE HCL 10 MG PO TABS: 10.0000 mg | ORAL_TABLET | Freq: Three times a day (TID) | ORAL | Status: DC | PRN

## 2021-09-26 MED ORDER — SODIUM CHLORIDE 0.9 % IV SOLN: 250.0000 mL | INTRAVENOUS | Status: DC | PRN

## 2021-09-26 MED ORDER — SODIUM CHLORIDE 0.9% FLUSH: 3.0000 mL | Freq: Two times a day (BID) | INTRAVENOUS | Status: DC

## 2021-09-26 MED ORDER — OXYCODONE HCL 5 MG PO TABS: 5.0000 mg | ORAL_TABLET | Freq: Four times a day (QID) | ORAL | Status: DC | PRN

## 2021-09-26 MED ORDER — LACTATED RINGERS IV BOLUS: 1000.0000 mL | Freq: Once | INTRAVENOUS | Status: DC

## 2021-09-26 MED ORDER — CALCIUM CARBONATE ANTACID 500 MG PO CHEW: 2.0000 | CHEWABLE_TABLET | ORAL | Status: DC | PRN

## 2021-09-26 MED ORDER — PRENATAL MULTIVITAMIN CH
1.0000 | ORAL_TABLET | Freq: Every day | ORAL | Status: DC
  Administered 2021-09-27: 1 via ORAL
  Filled 2021-09-26: qty 1

## 2021-09-26 MED ORDER — FERROUS SULFATE 325 (65 FE) MG PO TABS
325.0000 mg | ORAL_TABLET | ORAL | Status: DC
  Administered 2021-09-27: 325 mg via ORAL
  Filled 2021-09-26: qty 1

## 2021-09-26 MED ORDER — SODIUM CHLORIDE 0.9% FLUSH
3.0000 mL | INTRAVENOUS | Status: DC | PRN
Start: 2021-09-26 — End: 2021-09-27

## 2021-09-26 MED ORDER — ACETAMINOPHEN 325 MG PO TABS: 650.0000 mg | ORAL_TABLET | ORAL | Status: DC | PRN

## 2021-09-26 MED ORDER — DOCUSATE SODIUM 100 MG PO CAPS
100.0000 mg | ORAL_CAPSULE | Freq: Every day | ORAL | Status: DC
  Administered 2021-09-27: 100 mg via ORAL
  Filled 2021-09-26: qty 1

## 2021-09-26 MED ORDER — ZOLPIDEM TARTRATE 5 MG PO TABS: 5.0000 mg | ORAL_TABLET | Freq: Every evening | ORAL | Status: DC | PRN

## 2021-09-26 MED ORDER — ASPIRIN EC 81 MG PO TBEC
81.0000 mg | DELAYED_RELEASE_TABLET | Freq: Every day | ORAL | Status: DC
  Administered 2021-09-27: 81 mg via ORAL
  Filled 2021-09-26: qty 1
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
## 2021-09-27 ENCOUNTER — Encounter (HOSPITAL_COMMUNITY): Payer: Self-pay | Admitting: Obstetrics and Gynecology

## 2021-09-27 DIAGNOSIS — Z3A30 30 weeks gestation of pregnancy: Secondary | ICD-10-CM | POA: Diagnosis not present

## 2021-09-27 DIAGNOSIS — O26853 Spotting complicating pregnancy, third trimester: Secondary | ICD-10-CM | POA: Diagnosis not present

## 2021-09-27 LAB — KLEIHAUER-BETKE STAIN
# Vials RhIg: 1
Fetal Cells %: 0 %
Quantitation Fetal Hemoglobin: 0 mL
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
## 2021-09-27 NOTE — Plan of Care (Signed)
?  Problem: Education: ?Goal: Knowledge of General Education information will improve ?Description: Including pain rating scale, medication(s)/side effects and non-pharmacologic comfort measures ?Outcome: Completed/Met ?  ?Problem: Health Behavior/Discharge Planning: ?Goal: Ability to manage health-related needs will improve ?Outcome: Completed/Met ?  ?Problem: Clinical Measurements: ?Goal: Ability to maintain clinical measurements within normal limits will improve ?Outcome: Completed/Met ?Goal: Will remain free from infection ?Outcome: Completed/Met ?Goal: Diagnostic test results will improve ?Outcome: Completed/Met ?Goal: Respiratory complications will improve ?Outcome: Completed/Met ?Goal: Cardiovascular complication will be avoided ?Outcome: Completed/Met ?  ?Problem: Activity: ?Goal: Risk for activity intolerance will decrease ?Outcome: Completed/Met ?  ?Problem: Nutrition: ?Goal: Adequate nutrition will be maintained ?Outcome: Completed/Met ?  ?Problem: Coping: ?Goal: Level of anxiety will decrease ?Outcome: Completed/Met ?  ?Problem: Elimination: ?Goal: Will not experience complications related to bowel motility ?Outcome: Completed/Met ?Goal: Will not experience complications related to urinary retention ?Outcome: Completed/Met ?  ?Problem: Pain Managment: ?Goal: General experience of comfort will improve ?Outcome: Completed/Met ?  ?Problem: Safety: ?Goal: Ability to remain free from injury will improve ?Outcome: Completed/Met ?  ?Problem: Skin Integrity: ?Goal: Risk for impaired skin integrity will decrease ?Outcome: Completed/Met ?  ?Problem: Education: ?Goal: Knowledge of disease or condition will improve ?Outcome: Completed/Met ?Goal: Knowledge of the prescribed therapeutic regimen will improve ?Outcome: Completed/Met ?Goal: Individualized Educational Video(s) ?Outcome: Completed/Met ?  ?Problem: Clinical Measurements: ?Goal: Complications related to the disease process, condition or treatment will be  avoided or minimized ?Outcome: Completed/Met ?  ?

## 2021-09-28 ENCOUNTER — Encounter: Payer: Self-pay | Admitting: Obstetrics and Gynecology

## 2021-09-28 ENCOUNTER — Ambulatory Visit (INDEPENDENT_AMBULATORY_CARE_PROVIDER_SITE_OTHER): Payer: Medicaid Other | Admitting: Obstetrics and Gynecology

## 2021-09-28 DIAGNOSIS — O26899 Other specified pregnancy related conditions, unspecified trimester: Secondary | ICD-10-CM

## 2021-09-28 DIAGNOSIS — Z6791 Unspecified blood type, Rh negative: Secondary | ICD-10-CM | POA: Diagnosis not present

## 2021-09-28 DIAGNOSIS — O09299 Supervision of pregnancy with other poor reproductive or obstetric history, unspecified trimester: Secondary | ICD-10-CM

## 2021-09-28 DIAGNOSIS — Z3A30 30 weeks gestation of pregnancy: Secondary | ICD-10-CM

## 2021-09-28 DIAGNOSIS — Z3480 Encounter for supervision of other normal pregnancy, unspecified trimester: Secondary | ICD-10-CM

## 2021-09-28 LAB — TYPE AND SCREEN
ABO/RH(D): O NEG
Antibody Screen: POSITIVE
Unit division: 0
Unit division: 0
Weak D: POSITIVE

## 2021-09-28 LAB — BPAM RBC
Blood Product Expiration Date: 202305232359
Blood Product Expiration Date: 202305242359
Unit Type and Rh: 9500
Unit Type and Rh: 9500

## 2021-09-28 MED ORDER — RHO D IMMUNE GLOBULIN 1500 UNIT/2ML IJ SOSY
300.0000 ug | PREFILLED_SYRINGE | Freq: Once | INTRAMUSCULAR | Status: AC
  Administered 2021-09-28: 300 ug via INTRAMUSCULAR
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
## 2021-10-20 ENCOUNTER — Encounter: Payer: Self-pay | Admitting: Obstetrics & Gynecology

## 2021-10-20 ENCOUNTER — Telehealth: Payer: Self-pay

## 2021-10-20 ENCOUNTER — Telehealth (INDEPENDENT_AMBULATORY_CARE_PROVIDER_SITE_OTHER): Payer: Medicaid Other | Admitting: Obstetrics & Gynecology

## 2021-10-20 VITALS — BP 134/87 | HR 93

## 2021-10-20 DIAGNOSIS — Z3480 Encounter for supervision of other normal pregnancy, unspecified trimester: Secondary | ICD-10-CM

## 2021-10-20 DIAGNOSIS — O09299 Supervision of pregnancy with other poor reproductive or obstetric history, unspecified trimester: Secondary | ICD-10-CM

## 2021-10-20 DIAGNOSIS — Z3A33 33 weeks gestation of pregnancy: Secondary | ICD-10-CM

## 2021-10-20 DIAGNOSIS — O09293 Supervision of pregnancy with other poor reproductive or obstetric history, third trimester: Secondary | ICD-10-CM

## 2021-10-20 DIAGNOSIS — O1203 Gestational edema, third trimester: Secondary | ICD-10-CM
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
## 2021-10-20 NOTE — Telephone Encounter (Signed)
Called pt to schedule check out note appointments from visit on 10/20/21. Pt vm not set up.  ?

## 2021-10-24 ENCOUNTER — Encounter: Payer: Self-pay | Admitting: Obstetrics and Gynecology

## 2021-11-09 ENCOUNTER — Other Ambulatory Visit (HOSPITAL_COMMUNITY)
Admission: RE | Admit: 2021-11-09 | Discharge: 2021-11-09 | Disposition: A | Discharge status: Home / Self Care | Payer: Medicaid Other | Source: Ambulatory Visit | Attending: Family Medicine | Admitting: Family Medicine

## 2021-11-09 ENCOUNTER — Ambulatory Visit (INDEPENDENT_AMBULATORY_CARE_PROVIDER_SITE_OTHER): Payer: Medicaid Other | Admitting: Family Medicine

## 2021-11-09 VITALS — BP 127/86 | HR 91 | Wt 221.0 lb

## 2021-11-09 DIAGNOSIS — O09293 Supervision of pregnancy with other poor reproductive or obstetric history, third trimester: Secondary | ICD-10-CM

## 2021-11-09 DIAGNOSIS — Z3480 Encounter for supervision of other normal pregnancy, unspecified trimester: Secondary | ICD-10-CM

## 2021-11-09 DIAGNOSIS — O26893 Other specified pregnancy related conditions, third trimester: Secondary | ICD-10-CM

## 2021-11-09 DIAGNOSIS — O26899 Other specified pregnancy related conditions, unspecified trimester: Secondary | ICD-10-CM

## 2021-11-09 DIAGNOSIS — Z3A36 36 weeks gestation of pregnancy: Secondary | ICD-10-CM

## 2021-11-09 DIAGNOSIS — O09299 Supervision of pregnancy with other poor reproductive or obstetric history, unspecified trimester: Secondary | ICD-10-CM

## 2021-11-09 DIAGNOSIS — Z6791 Unspecified blood type, Rh negative: Secondary | ICD-10-CM

## 2021-11-09 NOTE — Progress Notes (Signed)
   PRENATAL VISIT NOTE  Subjective:  Karen Woodward is a 21 y.o. G3P1011 at [redacted]w[redacted]d being seen today for ongoing prenatal care.  She is currently monitored for the following issues for this low-risk pregnancy and has History of postpartum hemorrhage, currently pregnant; Hx of preeclampsia, prior pregnancy, currently pregnant; Supervision of other normal pregnancy, antepartum; Urinary tract infection in mother during second trimester of pregnancy; and Rh negative state in antepartum period on their problem list.  Patient reports backache and pelvic pressure .  Contractions: Irritability. Vag. Bleeding: None.  Movement: Present. Denies leaking of fluid.   The following portions of the patient's history were reviewed and updated as appropriate: allergies, current medications, past family history, past medical history, past social history, past surgical history and problem list.   Objective:   Vitals:   11/09/21 1108  BP: 127/86  Pulse: 91  Weight: 221 lb (100.2 kg)    Fetal Status: Fetal Heart Rate (bpm): 146 Fundal Height: 35 cm Movement: Present  Presentation: Vertex  General:  Alert, oriented and cooperative. Patient is in no acute distress.  Skin: Skin is warm and dry. No rash noted.   Cardiovascular: Normal heart rate noted  Respiratory: Normal respiratory effort, no problems with respiration noted  Abdomen: Soft, gravid, appropriate for gestational age.  Pain/Pressure: Present     Pelvic: Cervical exam performed in the presence of a chaperone Dilation: 2 Effacement (%): 20, 30 Station: -3  Extremities: Normal range of motion.  Edema: Trace  Mental Status: Normal mood and affect. Normal behavior. Normal judgment and thought content.   Assessment and Plan:  Pregnancy: G3P1011 at [redacted]w[redacted]d 1. Supervision of other normal pregnancy, antepartum Cultures today - Strep Gp B NAA - Cervicovaginal ancillary only( [place])  2. Rh negative state in antepartum period S/p rhogam @ 28 wks and  pp if indicated  3. Hx of preeclampsia, prior pregnancy, currently pregnant On ASA BP is ok today  Preterm labor symptoms and general obstetric precautions including but not limited to vaginal bleeding, contractions, leaking of fluid and fetal movement were reviewed in detail with the patient. Please refer to After Visit Summary for other counseling recommendations.   Return in 1 week (on 11/16/2021).  Future Appointments  Date Time Provider Department Center  11/15/2021  9:35 AM Between Bing, MD [facility]  11/24/2021 10:55 AM Calvert Cantor, CNM [facility]  11/29/2021  9:35 AM Anyanwu, Jethro Bastos, MD [facility]    Reva Bores, MD

## 2021-11-09 NOTE — Progress Notes (Signed)
ROB [redacted]w[redacted]d   ZT:IWPYKDX/IPJASN discomfort. Pain in legs as well. Pt would like a cervix check.

## 2021-11-10 ENCOUNTER — Other Ambulatory Visit: Payer: Self-pay

## 2021-11-10 ENCOUNTER — Inpatient Hospital Stay (HOSPITAL_COMMUNITY)
Admission: AD | Admit: 2021-11-10 | Discharge: 2021-11-13 | DRG: 807 | Disposition: A | Discharge status: Home / Self Care | Payer: Medicaid Other | Attending: Obstetrics & Gynecology | Admitting: Obstetrics & Gynecology

## 2021-11-10 DIAGNOSIS — O99214 Obesity complicating childbirth: Secondary | ICD-10-CM | POA: Diagnosis present

## 2021-11-10 DIAGNOSIS — O26893 Other specified pregnancy related conditions, third trimester: Secondary | ICD-10-CM | POA: Diagnosis present

## 2021-11-10 DIAGNOSIS — O09299 Supervision of pregnancy with other poor reproductive or obstetric history, unspecified trimester: Secondary | ICD-10-CM

## 2021-11-10 DIAGNOSIS — Z6791 Unspecified blood type, Rh negative: Secondary | ICD-10-CM

## 2021-11-10 DIAGNOSIS — Z3A36 36 weeks gestation of pregnancy: Secondary | ICD-10-CM

## 2021-11-10 DIAGNOSIS — O26899 Other specified pregnancy related conditions, unspecified trimester: Secondary | ICD-10-CM

## 2021-11-10 DIAGNOSIS — O479 False labor, unspecified: Principal | ICD-10-CM

## 2021-11-10 DIAGNOSIS — Z3480 Encounter for supervision of other normal pregnancy, unspecified trimester: Secondary | ICD-10-CM

## 2021-11-10 LAB — CERVICOVAGINAL ANCILLARY ONLY
Chlamydia: NEGATIVE
Comment: NEGATIVE
Comment: NORMAL
Neisseria Gonorrhea: NEGATIVE

## 2021-11-11 ENCOUNTER — Encounter (HOSPITAL_COMMUNITY): Payer: Self-pay | Admitting: Obstetrics and Gynecology

## 2021-11-11 ENCOUNTER — Inpatient Hospital Stay (HOSPITAL_COMMUNITY): Payer: Medicaid Other | Admitting: Anesthesiology

## 2021-11-11 DIAGNOSIS — O4593 Premature separation of placenta, unspecified, third trimester: Secondary | ICD-10-CM | POA: Diagnosis not present

## 2021-11-11 DIAGNOSIS — Z6791 Unspecified blood type, Rh negative: Secondary | ICD-10-CM | POA: Diagnosis not present

## 2021-11-11 DIAGNOSIS — O99214 Obesity complicating childbirth: Secondary | ICD-10-CM | POA: Diagnosis present

## 2021-11-11 DIAGNOSIS — O26893 Other specified pregnancy related conditions, third trimester: Secondary | ICD-10-CM | POA: Diagnosis present

## 2021-11-11 DIAGNOSIS — Z3A36 36 weeks gestation of pregnancy: Secondary | ICD-10-CM | POA: Diagnosis not present

## 2021-11-11 LAB — CBC
HCT: 37.7 % (ref 36.0–46.0)
Hemoglobin: 12.4 g/dL (ref 12.0–15.0)
MCH: 27.7 pg (ref 26.0–34.0)
MCHC: 32.9 g/dL (ref 30.0–36.0)
MCV: 84.3 fL (ref 80.0–100.0)
Platelets: 263 10*3/uL (ref 150–400)
RBC: 4.47 MIL/uL (ref 3.87–5.11)
RDW: 14.6 % (ref 11.5–15.5)
WBC: 14.6 10*3/uL — ABNORMAL HIGH (ref 4.0–10.5)
nRBC: 0 % (ref 0.0–0.2)

## 2021-11-11 LAB — RPR: RPR Ser Ql: NONREACTIVE

## 2021-11-11 LAB — GROUP B STREP BY PCR: Group B strep by PCR: NEGATIVE

## 2021-11-11 LAB — STREP GP B NAA: Strep Gp B NAA: NEGATIVE

## 2021-11-11 MED ORDER — PHENYLEPHRINE 80 MCG/ML (10ML) SYRINGE FOR IV PUSH (FOR BLOOD PRESSURE SUPPORT): 80.0000 ug | PREFILLED_SYRINGE | INTRAVENOUS | Status: DC | PRN

## 2021-11-11 MED ORDER — IBUPROFEN 600 MG PO TABS
600.0000 mg | ORAL_TABLET | Freq: Four times a day (QID) | ORAL | Status: DC
  Administered 2021-11-11 – 2021-11-12 (×5): 600 mg via ORAL
  Filled 2021-11-11 (×7): qty 1

## 2021-11-11 MED ORDER — ONDANSETRON HCL 4 MG/2ML IJ SOLN: 4.0000 mg | INTRAMUSCULAR | Status: DC | PRN

## 2021-11-11 MED ORDER — DIPHENHYDRAMINE HCL 50 MG/ML IJ SOLN: 12.5000 mg | INTRAMUSCULAR | Status: DC | PRN

## 2021-11-11 MED ORDER — TRANEXAMIC ACID-NACL 1000-0.7 MG/100ML-% IV SOLN: 1000.0000 mg | INTRAVENOUS | Status: AC

## 2021-11-11 MED ORDER — WITCH HAZEL-GLYCERIN EX PADS: 1.0000 "application " | MEDICATED_PAD | CUTANEOUS | Status: DC | PRN

## 2021-11-11 MED ORDER — OXYTOCIN-SODIUM CHLORIDE 30-0.9 UT/500ML-% IV SOLN
2.5000 [IU]/h | INTRAVENOUS | Status: DC
  Filled 2021-11-11: qty 500

## 2021-11-11 MED ORDER — ZOLPIDEM TARTRATE 5 MG PO TABS: 5.0000 mg | ORAL_TABLET | Freq: Every evening | ORAL | Status: DC | PRN

## 2021-11-11 MED ORDER — SENNOSIDES-DOCUSATE SODIUM 8.6-50 MG PO TABS
2.0000 | ORAL_TABLET | Freq: Every day | ORAL | Status: DC
  Administered 2021-11-11 – 2021-11-13 (×3): 2 via ORAL
  Filled 2021-11-11 (×3): qty 2

## 2021-11-11 MED ORDER — ACETAMINOPHEN 325 MG PO TABS: 650.0000 mg | ORAL_TABLET | ORAL | Status: DC | PRN

## 2021-11-11 MED ORDER — LACTATED RINGERS IV SOLN: 500.0000 mL | INTRAVENOUS | Status: DC | PRN

## 2021-11-11 MED ORDER — PENICILLIN G POT IN DEXTROSE 60000 UNIT/ML IV SOLN: 3.0000 10*6.[IU] | INTRAVENOUS | Status: DC

## 2021-11-11 MED ORDER — EPHEDRINE 5 MG/ML INJ
10.0000 mg | INTRAVENOUS | Status: DC | PRN
Start: 2021-11-11 — End: 2021-11-11

## 2021-11-11 MED ORDER — LACTATED RINGERS IV SOLN
500.0000 mL | Freq: Once | INTRAVENOUS | Status: AC
  Administered 2021-11-11: 500 mL via INTRAVENOUS

## 2021-11-11 MED ORDER — ONDANSETRON HCL 4 MG PO TABS: 4.0000 mg | ORAL_TABLET | ORAL | Status: DC | PRN

## 2021-11-11 MED ORDER — DIBUCAINE (PERIANAL) 1 % EX OINT
1.0000 "application " | TOPICAL_OINTMENT | CUTANEOUS | Status: DC | PRN
  Administered 2021-11-11: 1 via RECTAL
  Filled 2021-11-11: qty 28

## 2021-11-11 MED ORDER — OXYCODONE-ACETAMINOPHEN 5-325 MG PO TABS: 1.0000 | ORAL_TABLET | ORAL | Status: DC | PRN

## 2021-11-11 MED ORDER — OXYCODONE-ACETAMINOPHEN 5-325 MG PO TABS: 2.0000 | ORAL_TABLET | ORAL | Status: DC | PRN

## 2021-11-11 MED ORDER — SODIUM CHLORIDE 0.9 % IV SOLN
5.0000 10*6.[IU] | Freq: Once | INTRAVENOUS | Status: AC
  Administered 2021-11-11: 5 10*6.[IU] via INTRAVENOUS
  Filled 2021-11-11: qty 5

## 2021-11-11 MED ORDER — FENTANYL CITRATE (PF) 100 MCG/2ML IJ SOLN
100.0000 ug | INTRAMUSCULAR | Status: DC | PRN
  Administered 2021-11-11 (×3): 100 ug via INTRAVENOUS
  Filled 2021-11-11 (×3): qty 2

## 2021-11-11 MED ORDER — BENZOCAINE-MENTHOL 20-0.5 % EX AERO
1.0000 "application " | INHALATION_SPRAY | CUTANEOUS | Status: DC | PRN
  Administered 2021-11-11: 1 via TOPICAL
  Filled 2021-11-11: qty 56

## 2021-11-11 MED ORDER — SIMETHICONE 80 MG PO CHEW: 80.0000 mg | CHEWABLE_TABLET | ORAL | Status: DC | PRN

## 2021-11-11 MED ORDER — FENTANYL-BUPIVACAINE-NACL 0.5-0.125-0.9 MG/250ML-% EP SOLN
EPIDURAL | Status: DC | PRN
  Administered 2021-11-11: 12 mL/h via EPIDURAL

## 2021-11-11 MED ORDER — COCONUT OIL OIL: 1.0000 "application " | TOPICAL_OIL | Status: DC | PRN

## 2021-11-11 MED ORDER — OXYTOCIN BOLUS FROM INFUSION
333.0000 mL | Freq: Once | INTRAVENOUS | Status: AC
  Administered 2021-11-11: 333 mL via INTRAVENOUS

## 2021-11-11 MED ORDER — FENTANYL-BUPIVACAINE-NACL 0.5-0.125-0.9 MG/250ML-% EP SOLN
12.0000 mL/h | EPIDURAL | Status: DC | PRN
  Filled 2021-11-11: qty 250

## 2021-11-11 MED ORDER — SOD CITRATE-CITRIC ACID 500-334 MG/5ML PO SOLN
30.0000 mL | ORAL | Status: DC | PRN
  Administered 2021-11-11: 30 mL via ORAL
  Filled 2021-11-11: qty 30

## 2021-11-11 MED ORDER — TETANUS-DIPHTH-ACELL PERTUSSIS 5-2.5-18.5 LF-MCG/0.5 IM SUSY: 0.5000 mL | PREFILLED_SYRINGE | Freq: Once | INTRAMUSCULAR | Status: DC

## 2021-11-11 MED ORDER — TRANEXAMIC ACID-NACL 1000-0.7 MG/100ML-% IV SOLN
INTRAVENOUS | Status: AC
  Administered 2021-11-11: 1000 mg via INTRAVENOUS
  Filled 2021-11-11: qty 100

## 2021-11-11 MED ORDER — ONDANSETRON HCL 4 MG/2ML IJ SOLN
4.0000 mg | Freq: Four times a day (QID) | INTRAMUSCULAR | Status: DC | PRN
  Administered 2021-11-11: 4 mg via INTRAVENOUS
  Filled 2021-11-11: qty 2

## 2021-11-11 MED ORDER — LIDOCAINE HCL (PF) 1 % IJ SOLN: 30.0000 mL | INTRAMUSCULAR | Status: DC | PRN

## 2021-11-11 MED ORDER — PRENATAL MULTIVITAMIN CH
1.0000 | ORAL_TABLET | Freq: Every day | ORAL | Status: DC
  Administered 2021-11-12: 1 via ORAL
  Filled 2021-11-11 (×2): qty 1

## 2021-11-11 MED ORDER — LIDOCAINE HCL (PF) 1 % IJ SOLN
INTRAMUSCULAR | Status: DC | PRN
  Administered 2021-11-11: 10 mL via EPIDURAL
  Administered 2021-11-11: 2 mL via EPIDURAL

## 2021-11-11 MED ORDER — DIPHENHYDRAMINE HCL 25 MG PO CAPS: 25.0000 mg | ORAL_CAPSULE | Freq: Four times a day (QID) | ORAL | Status: DC | PRN

## 2021-11-11 MED ORDER — EPHEDRINE 5 MG/ML INJ: 10.0000 mg | INTRAVENOUS | Status: DC | PRN

## 2021-11-11 MED ORDER — LACTATED RINGERS IV SOLN: INTRAVENOUS | Status: DC

## 2021-11-11 NOTE — Lactation Note (Signed)
This note was copied from a baby's chart. Lactation Consultation Note  Patient Name: Karen Woodward EHMCN'O Date: 11/11/2021   Age:23 hours   Mother states she will call lactation if she would like [place] to visit her.   Consult Status  PRN    Dahlia Byes [facility] 11/11/2021, 10:37 AM

## 2021-11-11 NOTE — Progress Notes (Signed)
Patient ID: Karen Woodward, female   DOB: May 07, 2001, 21 y.o.   MRN: [id number] Doing well  Vitals:   11/11/21 0430 11/11/21 0500 11/11/21 0530 11/11/21 0605  BP: 128/82 128/81 (!) 112/51 (!) 100/54  Pulse: 82 (!) 133 81   Resp:      Temp:      TempSrc:      SpO2:      Weight:      Height:       FHR reassuring UCs not tracing well but there has been progress in dilation  Dilation: 7.5 Effacement (%): 90 Station: -2, -1 Presentation: Vertex Exam by:: Amado Nash, RN  Anticipate SVD

## 2021-11-11 NOTE — Anesthesia Preprocedure Evaluation (Signed)
Anesthesia Evaluation  Patient identified by MRN, date of birth, ID band Patient awake    Reviewed: Allergy & Precautions, Patient's Chart, lab work & pertinent test results  Airway Mallampati: II  TM Distance: >3 FB Neck ROM: Full    Dental no notable dental hx.    Pulmonary neg pulmonary ROS,    Pulmonary exam normal breath sounds clear to auscultation       Cardiovascular hypertension, Normal cardiovascular exam Rhythm:Regular Rate:Normal     Neuro/Psych  Headaches, negative psych ROS   GI/Hepatic negative GI ROS, Neg liver ROS,   Endo/Other  bmi 39  Renal/GU negative Renal ROS  negative genitourinary   Musculoskeletal negative musculoskeletal ROS (+)   Abdominal (+) + obese,   Peds negative pediatric ROS (+)  Hematology negative hematology ROS (+) Hb 12.4, plt 263   Anesthesia Other Findings   Reproductive/Obstetrics (+) Pregnancy                             Anesthesia Physical Anesthesia Plan  ASA: 2  Anesthesia Plan: Epidural   Post-op Pain Management:    Induction:   PONV Risk Score and Plan: 2  Airway Management Planned: Natural Airway  Additional Equipment: None  Intra-op Plan:   Post-operative Plan:   Informed Consent: I have reviewed the patients History and Physical, chart, labs and discussed the procedure including the risks, benefits and alternatives for the proposed anesthesia with the patient or authorized representative who has indicated his/her understanding and acceptance.       Plan Discussed with:   Anesthesia Plan Comments:         Anesthesia Quick Evaluation

## 2021-11-11 NOTE — Discharge Summary (Signed)
Postpartum Discharge Summary  Date of Service updated-yes     Patient Name: Karen Woodward DOB: 2001/01/16 MRN: [id number]  Date of admission: 11/10/2021 Delivery date:11/11/2021  Delivering provider: Renee Harder  Date of discharge: 11/13/2021  Admitting diagnosis: Normal labor [O80, Z37.9] Intrauterine pregnancy: [redacted]w[redacted]d    Secondary diagnosis:  Principal Problem:   Normal labor Active Problems:   History of postpartum hemorrhage, currently pregnant   Hx of preeclampsia, prior pregnancy, currently pregnant   Rh negative state in antepartum period   SVD (spontaneous vaginal delivery)  Additional problems: none    Discharge diagnosis: Preterm Pregnancy Delivered                                              Post partum procedures:rhogam Augmentation: AROM Complications: None  Hospital course: Onset of Labor With Vaginal Delivery      21y.o. yo G3P1011 at 335w5das admitted in Active Labor on 11/10/2021. Patient had an uncomplicated labor course as follows:  Membrane Rupture Time/Date: 9:12 AM ,11/11/2021   Delivery Method:Vaginal, Spontaneous  Episiotomy: None  Lacerations:  None  Patient had an uncomplicated postpartum course.  She is ambulating, tolerating a regular diet, passing flatus, and urinating well. Patient is discharged home in stable condition on 11/13/21.  Newborn Data: Birth date:11/11/2021  Birth time:9:22 AM  Gender:Female  Living status:Living  Apgars:9 ,9  Weight:2900 g   Magnesium Sulfate received: No BMZ received: No Rhophylac:Yes MMR:N/A T-DaP: no Flu: N/A Transfusion:No  Physical exam  Vitals:   11/11/21 2132 11/12/21 0109 11/12/21 0503 11/12/21 2200  BP: 112/73 112/69 109/68 113/62  Pulse: 89 79 73 86  Resp: _0 Temp: 98.4 F (36.9 C) 97.6 F (36.4 C) 98 F (36.7 C) 98.4 F (36.9 C)  TempSrc: Oral Oral Oral Oral  SpO2: 96% 98% 100%   Weight:      Height:       General: alert, cooperative, and no distress Lochia:  appropriate Uterine Fundus: firm Incision: N/A DVT Evaluation: No evidence of DVT seen on physical exam. Negative Homan's sign. No cords or calf tenderness. No significant calf/ankle edema. Labs: Lab Results  Component Value Date   WBC 14.6 (H) 11/11/2021   HGB 12.4 11/11/2021   HCT 37.7 11/11/2021   MCV 84.3 11/11/2021   PLT 263 11/11/2021      Latest Ref Rng & Units 08/17/2021    9:45 AM  CMP  Glucose 70 - 99 mg/dL 68    BUN 6 - 20 mg/dL 7    Creatinine 0.57 - 1.00 mg/dL 0.39    Sodium 134 - 144 mmol/L 135    Potassium 3.5 - 5.2 mmol/L 4.1    Chloride 96 - 106 mmol/L 102    CO2 20 - 29 mmol/L 18    Calcium 8.7 - 10.2 mg/dL 9.2    Total Protein 6.0 - 8.5 g/dL 6.4    Total Bilirubin 0.0 - 1.2 mg/dL <0.2    Alkaline Phos 44 - 121 IU/L 106    AST 0 - 40 IU/L 15    ALT 0 - 32 IU/L 12     Edinburgh Score:    11/11/2021   12:30 PM  Edinburgh Postnatal Depression Scale Screening Tool  I have been able to laugh and see the funny side of things. 0  I  have looked forward with enjoyment to things. 0  I have blamed myself unnecessarily when things went wrong. 0  I have been anxious or worried for no good reason. 0  I have felt scared or panicky for no good reason. 0  Things have been getting on top of me. 0  I have been so unhappy that I have had difficulty sleeping. 0  I have felt sad or miserable. 0  I have been so unhappy that I have been crying. 0  The thought of harming myself has occurred to me. 0  Edinburgh Postnatal Depression Scale Total 0     After visit meds:  Allergies as of 11/13/2021   No Known Allergies      Medication List     STOP taking these medications    aspirin EC 81 MG tablet   ferrous sulfate 325 (65 FE) MG tablet       TAKE these medications    ibuprofen 600 MG tablet Commonly known as: ADVIL Take 1 tablet (600 mg total) by mouth every 6 (six) hours.       Discharge home in stable condition Infant Feeding: Bottle and  Breast Infant Disposition:home with mother Discharge instruction: per After Visit Summary and Postpartum booklet. Activity: Advance as tolerated. Pelvic rest for 6 weeks.  Diet: routine diet Future Appointments: Future Appointments  Date Time Provider [place]  12/19/2021  3:50 PM Caren Macadam, MD [facility]   Follow up Visit:   Please schedule this patient for a In person postpartum visit in 6 weeks with the following provider: Any provider. Additional Postpartum F/U: n/a   Low risk pregnancy complicated by:  n/a Delivery mode:  Vaginal, Spontaneous  Anticipated Birth Control:  Condoms and Vasectomy  11/13/2021 Julianne Handler, CNM

## 2021-11-11 NOTE — Anesthesia Postprocedure Evaluation (Signed)
Anesthesia Post Note  Patient: Karen Woodward  Procedure(s) Performed: AN AD [place]     Patient location during evaluation: Mother Baby Anesthesia Type: Epidural Level of consciousness: awake, awake and alert and oriented Pain management: pain level controlled Vital Signs Assessment: post-procedure vital signs reviewed and stable Respiratory status: spontaneous breathing, nonlabored ventilation and respiratory function stable Cardiovascular status: stable Postop Assessment: patient able to bend at knees, no apparent nausea or vomiting, adequate PO intake and able to ambulate Anesthetic complications: no   No notable events documented.  Last Vitals:  Vitals:   11/11/21 1230 11/11/21 1332  BP: 118/69 109/66  Pulse: 66 90  Resp: 17 17  Temp: 36.7 C 36.7 C  SpO2:      Last Pain:  Vitals:   11/11/21 1507  TempSrc:   PainSc: 0-No pain   Pain Goal:                   [name]

## 2021-11-11 NOTE — MAU Note (Signed)
Pt says UC  strong since 5 pm PNC- [place]- Ve yesterday 2 cm  Denies HSV GBS- neg

## 2021-11-11 NOTE — Progress Notes (Signed)
Patient ID: Karen Woodward, female   DOB: Mar 30, 2001, 21 y.o.   MRN: [id number] Doing well, now with epidural  Vitals:   11/11/21 0100 11/11/21 0200 11/11/21 0310 11/11/21 0315  BP:  123/87 122/80 121/84  Pulse:  77 81 84  Resp: 20 18    Temp:  97.7 F (36.5 C)    TempSrc:  Oral    SpO2:  99%    Weight:      Height:       FHR stable UCs regular  Dilation: 6 Effacement (%): 90 Station: -2 Presentation: Vertex Exam by:: Renella Cunas, RN  Will anticipate SVD

## 2021-11-11 NOTE — Anesthesia Procedure Notes (Addendum)
Epidural Patient location during procedure: OB Start time: 11/11/2021 2:53 AM End time: 11/11/2021 3:09 AM  Staffing Anesthesiologist: Pervis Hocking, DO Performed: anesthesiologist   Preanesthetic Checklist Completed: patient identified, IV checked, risks and benefits discussed, monitors and equipment checked, pre-op evaluation and timeout performed  Epidural Patient position: sitting Prep: DuraPrep and site prepped and draped Patient monitoring: continuous pulse ox, blood pressure, heart rate and cardiac monitor Approach: midline Location: L3-L4 Injection technique: LOR air  Needle:  Needle type: Tuohy  Needle gauge: 17 G Needle length: 9 cm Needle insertion depth: 9 cm Catheter type: closed end flexible Catheter size: 19 Gauge Catheter at skin depth: 15 cm Test dose: negative  Assessment Sensory level: T8 Events: blood not aspirated, injection not painful, no injection resistance, no paresthesia and negative IV test  Additional Notes Patient identified. Risks/Benefits/Options discussed with patient including but not limited to bleeding, infection, nerve damage, paralysis, failed block, incomplete pain control, headache, blood pressure changes, nausea, vomiting, reactions to medication both or allergic, itching and postpartum back pain. Confirmed with bedside nurse the patient's most recent platelet count. Confirmed with patient that they are not currently taking any anticoagulation, have any bleeding history or any family history of bleeding disorders. Patient expressed understanding and wished to proceed. All questions were answered. Sterile technique was used throughout the entire procedure. Please see nursing notes for vital signs. Test dose was given through epidural catheter and negative prior to continuing to dose epidural or start infusion. Warning signs of high block given to the patient including shortness of breath, tingling/numbness in hands, complete motor block,  or any concerning symptoms with instructions to call for help. Patient was given instructions on fall risk and not to get out of bed. All questions and concerns addressed with instructions to call with any issues or inadequate analgesia.     *Screaming inconsolably throughout entire procedure despite needle presence or not, some improvement w/ IV fentanyl to eventually tolerate procedure. Large amount of adipose over spinous processes, difficult to palpate midline. Reason for block:procedure for pain

## 2021-11-11 NOTE — H&P (Signed)
Karen Woodward is a 21 y.o. female G3P1011 at [redacted]w[redacted]d presenting for uterine contractions. Denies leaking or bleeding  RN Note: Pt says UC  strong since 5 pm PNC- [place]- Ve yesterday 2 cm  Denies HSV GBS- neg  Pregnancy has  been followed [facility] and then [facility] and remarkable for: Patient Active Problem List   Diagnosis Date Noted   Normal labor 11/11/2021   Urinary tract infection in mother during second trimester of pregnancy 07/27/2021   Rh negative state in antepartum period 07/27/2021   History of postpartum hemorrhage, currently pregnant 04/20/2021   Hx of preeclampsia, prior pregnancy, currently pregnant 04/20/2021   Supervision of other normal pregnancy, antepartum 04/20/2021    . OB History     Gravida  3   Para  1   Term  1   Preterm  0   AB  1   Living  1      SAB  1   IAB  0   Ectopic  0   Multiple  0   Live Births  1          Past Medical History:  Diagnosis Date   Anemia    Dysmenorrhea 05/16/2018   Menometrorrhagia 05/16/2018   Migraine with aura    Morbid obesity with BMI of 40.0-44.9, adult (HCC)    Preeclampsia    Soft tissue swelling of back 05/16/2018   Past Surgical History:  Procedure Laterality Date   TONSILLECTOMY AND ADENOIDECTOMY  06/2014   Family History: family history includes Diabetes in her maternal grandfather; Hypertension in her mother. Social History:  reports that she has never smoked. She has never used smokeless tobacco. She reports that she does not drink alcohol and does not use drugs.     Maternal Diabetes: No Genetic Screening: Normal Maternal Ultrasounds/Referrals: Normal Fetal Ultrasounds or other Referrals:  None Maternal Substance Abuse:  No Significant Maternal Medications:  None Significant Maternal Lab Results:  Group B Strep negative Other Comments:  None  Review of Systems  Constitutional:  Negative for chills and fever.  Eyes:  Negative for visual disturbance.   Respiratory:  Negative for shortness of breath.   Gastrointestinal:  Positive for abdominal pain. Negative for diarrhea, nausea and vomiting.  Genitourinary:  Positive for pelvic pain. Negative for vaginal bleeding.  Musculoskeletal:  Positive for back pain.  Neurological:  Negative for weakness.  Maternal Medical History:  Reason for admission: Contractions.  Nausea.  Contractions: Onset was 3-5 hours ago.   Frequency: regular.   Perceived severity is moderate.   Fetal activity: Perceived fetal activity is normal.   Last perceived fetal movement was within the past hour.   Prenatal complications: No bleeding, PIH (had preeclampsia last pregnancy but normal this preg) or placental abnormality.   Prenatal Complications - Diabetes: none.  Vitals:   11/11/21 0005  BP: 124/84  Pulse: 79  Resp: 18  Temp: 97.6 F (36.4 C)  TempSrc: Oral  Weight: 100.4 kg  Height: 5\' 3"  (1.6 m)    Dilation: 5 Effacement (%): 90 Station: -3 Exam by:: [email or web address], RN Blood pressure 124/84, pulse 79, temperature 97.6 F (36.4 C), temperature source Oral, resp. rate 18, height 5\' 3"  (1.6 m), weight 100.4 kg, last menstrual period 02/27/2021. Maternal Exam:  Uterine Assessment: Contraction strength is moderate.  Contraction frequency is regular.  Abdomen: Patient reports no abdominal tenderness. Fetal presentation: vertex Introitus: Normal vulva. Normal vagina.  Ferning test: not done.  Nitrazine test: not  done. Pelvis: adequate for delivery.   Cervix: Cervix evaluated by digital exam.     Fetal Exam Fetal Monitor Review: Mode: ultrasound.   Baseline rate: 140.  Variability: moderate (6-25 bpm).   Pattern: accelerations present and no decelerations.   Fetal State Assessment: Category I - tracings are normal.  Physical Exam Constitutional:      General: She is not in acute distress.    Appearance: She is not ill-appearing or toxic-appearing.  HENT:     Head: Normocephalic.   Cardiovascular:     Rate and Rhythm: Normal rate.  Pulmonary:     Effort: Pulmonary effort is normal.  Abdominal:     General: There is no distension.     Tenderness: There is no abdominal tenderness.  Genitourinary:    General: Normal vulva.     Comments: Dilation: 5 Effacement (%): 90 Station: -3 Presentation: Vertex Exam by:: Joline Salt, RN  Musculoskeletal:        General: Normal range of motion.     Cervical back: Normal range of motion.  Skin:    General: Skin is warm and dry.  Neurological:     General: No focal deficit present.     Mental Status: She is alert.  Psychiatric:        Mood and Affect: Mood normal.    Prenatal labs: ABO, Rh: --/--/O NEG (04/17 1625) Antibody: POS (04/17 1625) Rubella: 4.83 (11/09 1103) RPR: Non Reactive (04/05 0923)  HBsAg: Negative (11/09 1103)  HIV: Non Reactive (04/05 0923)  GBS:     Assessment/Plan: Single IUP at [redacted]w[redacted]d Active Labor GBS negative  Admit to Labor and Delivery Routine orders Anticipate SVD    Wynelle Bourgeois 11/11/2021, 12:34 AM

## 2021-11-12 LAB — KLEIHAUER-BETKE STAIN
# Vials RhIg: 1
Fetal Cells %: 0 %
Quantitation Fetal Hemoglobin: 0 mL

## 2021-11-12 MED ORDER — RHO D IMMUNE GLOBULIN 1500 UNIT/2ML IJ SOSY
300.0000 ug | PREFILLED_SYRINGE | Freq: Once | INTRAMUSCULAR | Status: AC
  Administered 2021-11-12: 300 ug via INTRAVENOUS
  Filled 2021-11-12: qty 2

## 2021-11-12 NOTE — Progress Notes (Signed)
Post Partum Day #1 Subjective: no complaints, up ad lib, and tolerating PO; bottlefeeding; spouse plans on vasectomy; would like to go home today  Objective: Blood pressure 109/68, pulse 73, temperature 98 F (36.7 C), temperature source Oral, resp. rate 16, height 5\' 3"  (1.6 m), weight 100.4 kg, last menstrual period 02/27/2021, SpO2 100 %, unknown if currently breastfeeding.  Physical Exam:  General: alert, cooperative, and no distress Lochia: appropriate Uterine Fundus: firm DVT Evaluation: No evidence of DVT seen on physical exam.  Recent Labs    11/11/21 0107  HGB 12.4  HCT 37.7    Assessment/Plan: Plan for discharge tomorrow due to unlike baby d/c today with being a 36 weeker Awaiting Rhogam due to baby being O+   LOS: 1 day   Myrtis Ser CNM 11/12/2021, 7:55 AM

## 2021-11-13 LAB — RH IG WORKUP (INCLUDES ABO/RH)
Gestational Age(Wks): 36.5
Unit division: 0

## 2021-11-13 MED ORDER — IBUPROFEN 600 MG PO TABS: 600.0000 mg | ORAL_TABLET | Freq: Four times a day (QID) | ORAL | 0 refills | Status: DC

## 2021-11-14 LAB — BPAM RBC
Blood Product Expiration Date: 202307062359
Blood Product Expiration Date: 202307062359
Unit Type and Rh: 9500
Unit Type and Rh: 9500

## 2021-11-14 LAB — TYPE AND SCREEN
ABO/RH(D): O NEG
Antibody Screen: POSITIVE
Unit division: 0
Unit division: 0

## 2021-11-14 LAB — SURGICAL PATHOLOGY

## 2021-11-15 ENCOUNTER — Encounter: Payer: Medicaid Other | Admitting: Obstetrics and Gynecology

## 2021-11-21 ENCOUNTER — Telehealth (HOSPITAL_COMMUNITY): Payer: Self-pay | Admitting: *Deleted

## 2021-11-21 NOTE — Telephone Encounter (Signed)
Mom reports feeling good. No concerns about herself at this time. EPDS=1 [facility] score=0) Mom reports baby is doing well. Feeding, peeing, and pooping without difficulty. Safe sleep reviewed. Mom reports no concerns about baby at present.  Duffy Rhody, RN 11-21-2021 at 4:05pm

## 2021-11-24 ENCOUNTER — Encounter: Payer: Medicaid Other | Admitting: Advanced Practice Midwife

## 2021-11-29 ENCOUNTER — Encounter: Payer: Medicaid Other | Admitting: Obstetrics & Gynecology

## 2021-12-19 ENCOUNTER — Ambulatory Visit: Payer: Medicaid Other | Admitting: Family Medicine

## 2021-12-21 ENCOUNTER — Telehealth: Payer: Self-pay

## 2021-12-21 NOTE — Telephone Encounter (Signed)
Called pt to schedule for PP visit this week with Sam per inbasket. Pt stated she will call office back she is currently busy.

## 2021-12-22 NOTE — Progress Notes (Signed)
Left/rescheduled

## 2021-12-26 ENCOUNTER — Ambulatory Visit: Payer: Medicaid Other | Admitting: Family Medicine

## 2022-03-21 NOTE — ED Provider Notes (Signed)
 ------------------------------------------------------------------------------- Attestation signed by Darryll Bernardino Sharper, DO at 03/22/22 0033 Attending attestation: I reviewed the APP's note.  I agree with the APP's findings and plan. Bernardino CHRISTELLA Darryll, DO March 22, 2022 12:33 AM   -------------------------------------------------------------------------------  [facility] Emergency Department Provider Note  ED Clinical Impression   Final diagnoses:  Acute pain of left shoulder (Primary)    ED Assessment/Plan & Course   21 year old right-hand-dominant female presenting with left shoulder pain after reportedly dislocating it 5 days ago and reducing it herself.  On exam she is well-appearing in no acute distress.  Shoulder has normal range of motion with diffuse tenderness along the trapezius and shoulder.  Extremity is neurovascularly intact.  Plain film negative for acute abnormality.  Suspect sequela from shoulder dislocation based on history.  Patient is given a sling for comfort and range of motion exercises.  Recommendations for ibuprofen  and prescription for Flexeril .  Orthopedic referral made.    History   Chief Complaint  Shoulder Pain   HPI    Karen Woodward is a 21 y.o. right-hand-dominant female presenting with left shoulder pain.  Patient states that on October 6 she was reaching behind her to turn off a spigot when her shoulder popped out.  She was able to pop it back in.  She is here now with continued pain in her shoulder radiating up into her neck and down to her hand.  She states she had a dislocation of her shoulder a while back but she does not member which 1.       No past medical history on file.  Past Surgical History:  Procedure Laterality Date  . TONSILLECTOMY  03/12/2014     Current Facility-Administered Medications:  .  cyclobenzaprine  (FLEXERIL ) 10 MG tablet, , , ,   Current Outpatient Medications:  .  ferrous sulfate  325 (65 FE) MG  tablet, Take 1 tablet (325 mg total) by mouth in the morning., Disp: , Rfl:  .  cyclobenzaprine  (FLEXERIL ) 10 MG tablet, Take 0.5-1 tablets (5-10 mg total) by mouth two (2) times a day as needed for muscle spasms., Disp: 10 tablet, Rfl: 0  Allergies Patient has no known allergies.  History reviewed. No pertinent family history.  Social History Social History   Tobacco Use  . Smoking status: Never  . Smokeless tobacco: Never  Vaping Use  . Vaping Use: Every day  Substance Use Topics  . Alcohol use: Yes  . Drug use: Never    Review of Systems Review of Systems  Musculoskeletal:  Positive for arthralgias.  Neurological:  Positive for numbness.  All other systems reviewed and are negative.   Physical Exam   ED Triage Vitals  Enc Vitals Group     BP 03/21/22 1647 133/81     Heart Rate 03/21/22 1648 84     SpO2 Pulse 03/21/22 1647 91     Resp 03/21/22 1647 18     Temp 03/21/22 1647 36.5 C (97.7 F)     Temp Source 03/21/22 1647 Temporal     SpO2 03/21/22 1647 98 %     Weight 03/21/22 1647 85.7 kg (189 lb)     Height 03/21/22 1647 1.6 m (5' 3)   Physical Exam Vitals and nursing note reviewed.  Constitutional:      General: She is not in acute distress.    Appearance: She is not ill-appearing, toxic-appearing or diaphoretic.  HENT:     Head: Normocephalic and atraumatic.     Right Ear:  External ear normal.     Left Ear: External ear normal.  Eyes:     General: No scleral icterus. Cardiovascular:     Rate and Rhythm: Normal rate and regular rhythm.     Pulses:          Radial pulses are 2+ on the left side.  Pulmonary:     Effort: Pulmonary effort is normal.     Breath sounds: Normal breath sounds.  Musculoskeletal:     Cervical back: Normal range of motion.     Comments: Left shoulder with grossly normal inspection without rash, lesion or deformity.  No ecchymosis.  Diffusely tender along the trapezius.  Range of motion grossly intact.  Some pain with movement.   No instability or laxity.  Skin:    General: Skin is warm and dry.     Capillary Refill: Capillary refill takes less than 2 seconds.  Neurological:     General: No focal deficit present.     Mental Status: She is alert and oriented to person, place, and time.     Sensory: No sensory deficit.     Motor: No weakness.  Psychiatric:        Mood and Affect: Mood normal.        Behavior: Behavior normal.      EKG    Labs   No results found for this visit on 03/21/22.  Radiology   XR Shoulder 3 Or More Views Left  Final Result  No radiographic evidence of acute fracture or dislocation of the left shoulder.      Procedures         Brandy Dunnings [place], [place] 03/21/22 [phone]

## 2022-06-03 ENCOUNTER — Other Ambulatory Visit: Payer: Self-pay | Admitting: Obstetrics and Gynecology

## 2023-01-03 ENCOUNTER — Encounter: Payer: Self-pay | Admitting: Emergency Medicine

## 2023-01-03 ENCOUNTER — Ambulatory Visit
Admission: EM | Admit: 2023-01-03 | Discharge: 2023-01-03 | Disposition: A | Discharge status: Home / Self Care | Payer: Managed Care, Other (non HMO)

## 2023-01-03 DIAGNOSIS — R0602 Shortness of breath: Secondary | ICD-10-CM

## 2023-01-03 DIAGNOSIS — R42 Dizziness and giddiness: Secondary | ICD-10-CM

## 2023-01-03 DIAGNOSIS — R0789 Other chest pain: Secondary | ICD-10-CM

## 2023-01-03 LAB — POCT FASTING CBG KUC MANUAL ENTRY: POCT Glucose (KUC): 99 mg/dL (ref 70–99)

## 2023-01-03 MED ORDER — SERTRALINE HCL 100 MG PO TABS: 100.0000 mg | ORAL_TABLET | Freq: Every day | ORAL | 1 refills | Status: DC

## 2023-01-03 MED ORDER — ALBUTEROL SULFATE HFA 108 (90 BASE) MCG/ACT IN AERS
2.0000 | INHALATION_SPRAY | Freq: Four times a day (QID) | RESPIRATORY_TRACT | 2 refills | Status: DC | PRN
Start: 2023-01-03 — End: 2023-12-26

## 2023-01-03 NOTE — ED Triage Notes (Addendum)
States she feels like its hard to breathe and like she can't take a full breath. Also states she feels like her sugar is dropping, reports a dizziness that feels like she's spinning. Denies having felt the same in the past. Denies fever, near syncope, headaches, generalized body aches, sore throat, cough, ear pain, CP, wheezing, abdominal pain, N/V/D, changes to bowel or bladder habits. 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 States she feels as though she's been working out. LMP: 12/23/2022.   Patient also requesting a refill of her 100 mg Zoloft, last taken today.

## 2023-01-03 NOTE — ED Provider Notes (Signed)
[facility]    CSN: [id number] Arrival date & time: 01/03/23  1640      History   Chief Complaint Chief Complaint  Patient presents with   Shortness of Breath    HPI Karen Woodward is a 22 y.o. female.   Patient presents for evaluation of a sensation of difficulty taking a deep breath, chest tightness and jittering to the bilateral hands beginning 2 days ago, worsening today.  Associated dizziness described as if she is off balance.  Feels as if her blood sugars dropped but denies history of diabetes, has been told in the past that she may be prediabetic but all lab work within normal limits.  Denies respiratory history.  Denies presence of wheezing.  Denies fever or URI symptoms.  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  Currently taking Zoloft, dosage change from 150 to 100 mg daily beginning 2-1/2 weeks ago.  Endorses 8 glasses of water daily, 2 glasses of caffeine intake.  Endorses first occurrence of symptoms.  Denies tobacco or alcohol use.  Past Medical History:  Diagnosis Date   Anemia    Dysmenorrhea 05/16/2018   Menometrorrhagia 05/16/2018   Migraine with aura    Morbid obesity with BMI of 40.0-44.9, adult (HCC)    Preeclampsia    Soft tissue swelling of back 05/16/2018    Patient Active Problem List   Diagnosis Date Noted   Normal labor 11/11/2021   SVD (spontaneous vaginal delivery) 11/11/2021   Urinary tract infection in mother during second trimester of pregnancy 07/27/2021   Rh negative state in antepartum period 07/27/2021   History of postpartum hemorrhage, currently pregnant 04/20/2021   Hx of preeclampsia, prior pregnancy, currently pregnant 04/20/2021   Supervision of other normal pregnancy, antepartum 04/20/2021    Past Surgical History:  Procedure Laterality Date   TONSILLECTOMY AND ADENOIDECTOMY  06/2014    OB History     Gravida  3   Para  2   Term  1   Preterm  1   AB  1   Living  2      SAB  1   IAB  0   Ectopic  0    Multiple  0   Live Births  2            Home Medications    Prior to Admission medications   Medication Sig Start Date End Date Taking? Authorizing Provider  albuterol (VENTOLIN HFA) 108 (90 Base) MCG/ACT inhaler Inhale 2 puffs into the lungs every 6 (six) hours as needed for wheezing or shortness of breath. 01/03/23  Yes Maki Sweetser R, NP  ibuprofen (ADVIL) 600 MG tablet Take 1 tablet (600 mg total) by mouth every 6 (six) hours. 11/13/21   Donette Larry, CNM  sertraline (ZOLOFT) 100 MG tablet Take 1 tablet (100 mg total) by mouth daily. 01/03/23   Valinda Hoar, NP    Family History Family History  Problem Relation Age of Onset   Hypertension Mother    Diabetes Maternal Grandfather     Social History Social History   Tobacco Use   Smoking status: Never   Smokeless tobacco: Never  Vaping Use   Vaping status: Never Used  Substance Use Topics   Alcohol use: No   Drug use: No     Allergies   Patient has no known allergies.   Review of Systems Review of Systems  Respiratory:  Positive for shortness of breath.      Physical Exam Triage Vital  Signs ED Triage Vitals [01/03/23 1659]  Encounter Vitals Group     BP 118/80     Systolic BP Percentile      Diastolic BP Percentile      Pulse Rate (!) 101     Resp 18     Temp 98.6 F (37 C)     Temp Source Oral     SpO2 98 %     Weight      Height      Head Circumference      Peak Flow      Pain Score 0     Pain Loc      Pain Education      Exclude from Growth Chart    No data found.  Updated Vital Signs BP 118/80 (BP Location: Left Arm)   Pulse (!) 101   Temp 98.6 F (37 C) (Oral)   Resp 18   LMP 12/23/2022   SpO2 98%   Visual Acuity Right Eye Distance:   Left Eye Distance:   Bilateral Distance:    Right Eye Near:   Left Eye Near:    Bilateral Near:     Physical Exam Constitutional:      Appearance: Normal appearance.  HENT:     Head: Normocephalic.  Eyes:     Extraocular  Movements: Extraocular movements intact.  Cardiovascular:     Rate and Rhythm: Normal rate and regular rhythm.     Pulses: Normal pulses.     Heart sounds: Normal heart sounds.  Pulmonary:     Effort: Pulmonary effort is normal.     Breath sounds: Normal breath sounds.  Neurological:     General: No focal deficit present.     Mental Status: She is alert and oriented to person, place, and time. Mental status is at baseline.      UC Treatments / Results  Labs (all labs ordered are listed, but only abnormal results are displayed) Labs Reviewed  POCT FASTING CBG [facility] MANUAL ENTRY    EKG   Radiology No results found.  Procedures Procedures (including critical care time)  Medications Ordered in UC Medications - No data to display  Initial Impression / Assessment and Plan / UC Course  I have reviewed the triage vital signs and the nursing notes.  Pertinent labs & imaging results that were available during my care of the patient were reviewed by me and considered in my medical decision making (see chart for details).  Dizziness, shortness of breath, chest tightness  Vital signs are stable patient is in no signs of distress nontoxic-appearing, point-of-care CBG 99, S1 and S2 heard to auscultation, as well as clear lung sounds on exam, due to lack of accompanying symptoms low suspicion for pneumonia or bronchitis therefore imaging deferred, declined CMP CBC and vitamin D, history of deficiency, prescribed albuterol inhaler for symptom management, requested refill on Zoloft, prescribed for 2 months, advised PCP follow-up in 1 to 2 weeks for reevaluation of symptoms Final Clinical Impressions(s) / UC Diagnoses   Final diagnoses:  Dizziness  Shortness of breath  Chest tightness     Discharge Instructions      Cause of your symptoms unknown at this time  Blood pressure and heart rate are within normal limits at this time  Blood sugar is 99 which is within the normal  limit  On exam your lungs are clear and you are getting enough air without assistance, have low suspicion that you have pneumonia or bronchitis if  you have not had a recent respiratory illness  Blood work checking your electrolytes, kidney, liver, blood levels and vitamin D are pending, you will be notified of any concerning values until her move forward  Will move forward when attempting to manage your symptoms  You may use albuterol inhaler taking 2 puffs every 6 hours as needed for shortness of breath and chest tightness  Ensure that you are getting adequate fluid intake to maintain your hydration  Please schedule follow-up appointment in 2 weeks with your primary doctor for reevaluation of symptoms   ED Prescriptions     Medication Sig Dispense Auth. Provider   sertraline (ZOLOFT) 100 MG tablet Take 1 tablet (100 mg total) by mouth daily. 30 tablet Zayli Villafuerte R, NP   albuterol (VENTOLIN HFA) 108 (90 Base) MCG/ACT inhaler Inhale 2 puffs into the lungs every 6 (six) hours as needed for wheezing or shortness of breath. 8 g Valinda Hoar, NP      PDMP not reviewed this encounter.   Valinda Hoar, [place] 01/03/23 1819

## 2023-01-03 NOTE — Discharge Instructions (Addendum)
Cause of your symptoms unknown at this time  Blood pressure and heart rate are within normal limits at this time  Blood sugar is 99 which is within the normal limit  On exam your lungs are clear and you are getting enough air without assistance, have low suspicion that you have pneumonia or bronchitis if you have not had a recent respiratory illness  Blood work checking your electrolytes, kidney, liver, blood levels and vitamin D are pending, you will be notified of any concerning values until her move forward  Will move forward when attempting to manage your symptoms  You may use albuterol inhaler taking 2 puffs every 6 hours as needed for shortness of breath and chest tightness  Ensure that you are getting adequate fluid intake to maintain your hydration  Please schedule follow-up appointment in 2 weeks with your primary doctor for reevaluation of symptoms
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
## 2023-10-08 ENCOUNTER — Other Ambulatory Visit: Payer: Self-pay | Admitting: Adult Health

## 2023-10-08 DIAGNOSIS — R109 Unspecified abdominal pain: Secondary | ICD-10-CM

## 2023-11-21 ENCOUNTER — Ambulatory Visit: Admitting: Certified Nurse Midwife

## 2023-11-21 ENCOUNTER — Encounter: Payer: Self-pay | Admitting: Certified Nurse Midwife

## 2023-11-21 VITALS — BP 115/72 | HR 87 | Wt 163.0 lb

## 2023-11-21 DIAGNOSIS — Z3002 Counseling and instruction in natural family planning to avoid pregnancy: Secondary | ICD-10-CM

## 2023-11-21 DIAGNOSIS — N946 Dysmenorrhea, unspecified: Secondary | ICD-10-CM | POA: Diagnosis not present

## 2023-11-21 DIAGNOSIS — R1033 Periumbilical pain: Secondary | ICD-10-CM | POA: Diagnosis not present

## 2023-11-21 DIAGNOSIS — N941 Unspecified dyspareunia: Secondary | ICD-10-CM | POA: Diagnosis not present

## 2023-11-21 NOTE — Progress Notes (Signed)
 Issues with periods for years- but recently periods are more painful, pain with intercourse and having N/V/D before and during her cycle  Get also swelling her in lower back as well  During ovulation get the back swelling and cramping too    Not on any birth control

## 2023-11-22 NOTE — Progress Notes (Signed)
 GYNECOLOGY OFFICE VISIT NOTE  History:   Karen Woodward is a 23 y.o. [phone] here today for complaints of painful periods. Patient states after her last baby she has been having more painful periods and increased bleeding. She states that they are typically regular however she can saturate a super tampon in 2 hours.  She also reports increased swelling around her belly button and low back during her periods as well. She denies bloating and denies taking pain meds during her periods . She denies any abnormal vaginal discharge, bleeding, pelvic pain or other concerns.     Past Medical History:  Diagnosis Date   Anemia    Dysmenorrhea 05/16/2018   Menometrorrhagia 05/16/2018   Migraine with aura    Morbid obesity with BMI of 40.0-44.9, adult (HCC)    Preeclampsia    Soft tissue swelling of back 05/16/2018    Past Surgical History:  Procedure Laterality Date   TONSILLECTOMY AND ADENOIDECTOMY  06/2014    The following portions of the patient's history were reviewed and updated as appropriate: allergies, current medications, past family history, past medical history, past social history, past surgical history and problem list.   Health Maintenance:  Normal pap and negative HRHPV on 03/24/2021.   Review of Systems:  Pertinent items noted in HPI and remainder of comprehensive ROS otherwise negative.  Physical Exam:  BP 115/72   Pulse 87   Wt 163 lb (73.9 kg)   LMP 10/31/2023 (Exact Date)   BMI 28.87 kg/m  CONSTITUTIONAL: Well-developed, well-nourished female in no acute distress.  HEENT:  Normocephalic, atraumatic. External right and left ear normal. No scleral icterus.  NECK: Normal range of motion, supple, no masses noted on observation SKIN: No rash noted. Not diaphoretic. No erythema. No pallor. Belly button noted to be about 3-4 cm deep and tender on palpation. No Redness or irritation noted.  MUSCULOSKELETAL: Normal range of motion. No edema noted. NEUROLOGIC: Alert and  oriented to person, place, and time. Normal muscle tone coordination. No cranial nerve deficit noted. PSYCHIATRIC: Normal mood and affect. Normal behavior. Normal judgment and thought content. CARDIOVASCULAR: Normal heart rate noted RESPIRATORY: Effort and breath sounds normal, no problems with respiration noted ABDOMEN: No masses noted. No other overt distention noted.   PELVIC: Deferred at patient request.   Labs and Imaging No results found for this or any previous visit (from the past week). No results found.    Assessment and Plan:    1. Painful menstrual periods (Primary) - US  ordered for new onset of worsening pain and bleeding for periods.  - Recommended PO Pain meds prior to start of periods.  - US  Abdomen Complete; Future - US  PELVIS TRANSVAGINAL NON-OB (TV ONLY); Future - Ambulatory referral to Physical Therapy  2. Natural family planning - Uses NFP for contraception.    3. Periumbilical abdominal pain - Recommended to stop cleaning 3 times per day and use a Small amount of antibiotic ointment externally for healing.  - US  Abdomen Complete; Future - US  PELVIS TRANSVAGINAL NON-OB (TV ONLY); Future - Ambulatory referral to Physical Therapy  4. Pain in female genitalia on intercourse - Exam deferred per patient request as she has her children with her. - US  Abdomen Complete; Future - US  PELVIS TRANSVAGINAL NON-OB (TV ONLY); Future - Ambulatory referral to Physical Therapy   Routine preventative health maintenance measures emphasized. Please refer to After Visit Summary for other counseling recommendations.   Return in about 2 weeks (around 12/05/2023) for PAP smear .  I spent 30 minutes dedicated to the care of this patient including pre-visit review of records, face to face time with the patient discussing her conditions and treatments and post visit orders.   Julene Rahn Maurie Southern) Marlys Singh, MSN, CNM  [facility]  11/22/23 6:41 PM

## 2023-11-28 ENCOUNTER — Ambulatory Visit (HOSPITAL_COMMUNITY)
Admission: RE | Admit: 2023-11-28 | Discharge: 2023-11-28 | Disposition: A | Discharge status: Home / Self Care | Source: Ambulatory Visit | Attending: Certified Nurse Midwife | Admitting: Certified Nurse Midwife

## 2023-11-28 ENCOUNTER — Ambulatory Visit: Payer: Self-pay | Admitting: Certified Nurse Midwife

## 2023-11-28 DIAGNOSIS — R1033 Periumbilical pain: Secondary | ICD-10-CM | POA: Diagnosis present

## 2023-11-28 DIAGNOSIS — N941 Unspecified dyspareunia: Secondary | ICD-10-CM | POA: Insufficient documentation

## 2023-11-28 DIAGNOSIS — N946 Dysmenorrhea, unspecified: Secondary | ICD-10-CM | POA: Insufficient documentation

## 2023-11-28 DIAGNOSIS — Z3002 Counseling and instruction in natural family planning to avoid pregnancy: Secondary | ICD-10-CM

## 2023-11-28 DIAGNOSIS — K429 Umbilical hernia without obstruction or gangrene: Secondary | ICD-10-CM

## 2023-11-30 ENCOUNTER — Ambulatory Visit (INDEPENDENT_AMBULATORY_CARE_PROVIDER_SITE_OTHER): Admitting: Surgery

## 2023-11-30 ENCOUNTER — Encounter: Payer: Self-pay | Admitting: Surgery

## 2023-11-30 VITALS — BP 119/77 | HR 79 | Temp 97.8°F | Ht 63.0 in | Wt 158.4 lb

## 2023-11-30 DIAGNOSIS — K429 Umbilical hernia without obstruction or gangrene: Secondary | ICD-10-CM | POA: Diagnosis not present

## 2023-11-30 DIAGNOSIS — L02216 Cutaneous abscess of umbilicus: Secondary | ICD-10-CM | POA: Diagnosis not present

## 2023-11-30 MED ORDER — AMOXICILLIN-POT CLAVULANATE 875-125 MG PO TABS: 1.0000 | ORAL_TABLET | Freq: Two times a day (BID) | ORAL | 0 refills | Status: DC

## 2023-11-30 NOTE — Patient Instructions (Addendum)
 Skin Abscess  A skin abscess is an infected area on or under your skin. It contains pus and other material. An abscess may also be called a furuncle, carbuncle, or boil. It is often the result of an infection caused by bacteria. An abscess can occur in or on almost any part of your body. Sometimes, an abscess may break open (rupture) on its own. In most cases, it will keep getting worse unless it is treated. An abscess can cause pain and make you feel ill. An untreated abscess can cause infection to spread to other parts of your body or your bloodstream. The abscess may need to be drained. You may also need to take antibiotics. What are the causes? An abscess occurs when germs, like bacteria, pass through your skin and cause an infection. This may be caused by: A scrape or cut on your skin. A puncture wound through your skin, such as a needle injection or insect bite. Blocked oil or sweat glands. Blocked and infected hair follicles. A fluid-filled sac that forms beneath your skin (sebaceous cyst) and becomes infected. What increases the risk? You may be more likely to develop an abscess if: You have problems with blood circulation, or you have a weak body defense system (immune system). You have diabetes. You have dry and irritated skin. You get injections often or use IV drugs. You have a foreign body in a wound, such as a splinter. You smoke or use tobacco products. What are the signs or symptoms? Symptoms of this condition include: A painful, firm bump under the skin. A bump with pus at the top. This may break through the skin and drain. Other symptoms include: Redness and swelling around the abscess. Warmth or tenderness. Swelling of the lymph nodes (glands) near the abscess. A sore on the skin. How is this diagnosed? This condition may be diagnosed based on a physical exam and your medical history. You may also have tests done, such as: A test of a sample of pus. This may be done  to find what is causing the infection. Blood tests. Imaging tests, such as an ultrasound, CT scan, or MRI. How is this treated? A small abscess that drains on its own may not need to be treated. Treatment for larger abscesses may include: Moist heat or a heat pack applied to the area a few times a day. Incision and drainage. This is a procedure to drain the abscess. Antibiotics. For a severe abscess, you may first get antibiotics through an IV and then change to antibiotics by mouth. Follow these instructions at home: Medicines Take over-the-counter and prescription medicines only as told by your provider. If you were prescribed antibiotics, take them as told by your provider. Do not stop using the antibiotic even if you start to feel better. Abscess care  If you have an abscess that has not drained, apply heat to the affected area. Use the heat source that your provider recommends, such as a moist heat pack or a heating pad. Place a towel between your skin and the heat source. Leave the heat on for 20-30 minutes at a time. If your skin turns bright red, remove the heat right away to prevent burns. The risk of burns is higher if you cannot feel pain, heat, or cold. Follow instructions from your provider about how to take care of your abscess. Make sure you: Cover the abscess with a bandage (dressing). Wash your hands with soap and water for at least 20 seconds before  and after you change the dressing or gauze. If soap and water are not available, use hand sanitizer. Change your dressing or gauze as told by your provider. Check your abscess every day for signs of an infection that is getting worse. Check for: More redness, swelling, pain, or tenderness. More fluid or blood. Warmth. More pus or a worse smell. General instructions To avoid spreading the infection: Do not share personal care items, towels, or hot tubs with others. Avoid making skin contact with other people. Be careful  when getting rid of used dressings, wound packing, or any drainage from the abscess. Do not use any products that contain nicotine or tobacco. These products include cigarettes, chewing tobacco, and vaping devices, such as e-cigarettes. If you need help quitting, ask your provider. Do not use any creams, ointments, or liquids unless you have been told to by your provider. Contact a health care provider if: You see redness that spreads quickly or red streaks on your skin spreading away from the abscess. You have any signs of worse infection at the abscess. You vomit every time you eat or drink. You have a fever, chills, or muscle aches. The cyst or abscess returns. Get help right away if: You have severe pain. You make less pee (urine) than normal. This information is not intended to replace advice given to you by your health care provider. Make sure you discuss any questions you have with your health care provider. Document Revised: 01/11/2022 Document Reviewed: 01/11/2022 Elsevier Patient Education  2024 [employer].

## 2023-12-02 ENCOUNTER — Encounter: Payer: Self-pay | Admitting: Surgery

## 2023-12-02 NOTE — Progress Notes (Signed)
 11/30/2023  Reason for Visit:  Umbilical hernia  Requesting Provider:  Delilah Cedar, CNM  History of Present Illness: Karen Woodward is a 23 y.o. female presenting for evaluation of an umbilical hernia.  The patient reports having this for the past few months.  She has noticed pain at the umbilicus particularly when she's bending forward or doing activity with exertion.  She has noticed that the skin of the umbilicus is more swollen and reports that she's used to having a very deep umbilicus.  Her last pregnancy was in 2023 but at the time she did not notice any issues with the umbilicus.  She sometimes gets nausea with the pain.  Denies any bowel movement issues.  The pain spreads laterally at the level of the umbilicus but does not radiate out significantly.  Denies any fevers, chills, chest pain, shortness of breath.  She also reports that she's having drainage from the umbilicus and has some foul odor.  Past Medical History: Past Medical History:  Diagnosis Date   Anemia    Dysmenorrhea 05/16/2018   Menometrorrhagia 05/16/2018   Migraine with aura    Morbid obesity with BMI of 40.0-44.9, adult (HCC)    Preeclampsia    Soft tissue swelling of back 05/16/2018     Past Surgical History: Past Surgical History:  Procedure Laterality Date   TONSILLECTOMY AND ADENOIDECTOMY  06/2014    Home Medications: Prior to Admission medications   Medication Sig Start Date End Date Taking? Authorizing Provider  albuterol  (VENTOLIN  HFA) 108 (90 Base) MCG/ACT inhaler Inhale 2 puffs into the lungs every 6 (six) hours as needed for wheezing or shortness of breath. 01/03/23  Yes White, Adrienne R, NP  amoxicillin -clavulanate (AUGMENTIN ) 875-125 MG tablet Take 1 tablet by mouth 2 (two) times daily for 7 days. 11/30/23 12/07/23 Yes PiscoyaAloysius, MD    Allergies: No Known Allergies  Social History:  reports that she has never smoked. She has never used smokeless tobacco. She reports that she does not  drink alcohol and does not use drugs.   Family History: Family History  Problem Relation Age of Onset   Hypertension Mother    Diabetes Maternal Grandfather     Review of Systems: Review of Systems  Constitutional:  Negative for chills and fever.  Respiratory:  Negative for shortness of breath.   Cardiovascular:  Negative for chest pain.  Gastrointestinal:  Positive for abdominal pain and nausea. Negative for constipation and vomiting.  Genitourinary:  Negative for dysuria.  Skin:        Drainage from umbilicus    Physical Exam BP 119/77   Pulse 79   Temp 97.8 F (36.6 C) (Oral)   Ht 5' 3 (1.6 m)   Wt 158 lb 6.4 oz (71.8 kg)   LMP 10/31/2023 (Exact Date)   SpO2 99%   BMI 28.06 kg/m  CONSTITUTIONAL: No acute distress, well nourished. HEENT:  Normocephalic, atraumatic, extraocular motion intact. RESPIRATORY:  Lungs are clear, and breath sounds are equal bilaterally. Normal respiratory effort without pathologic use of accessory muscles. CARDIOVASCULAR: Heart is regular without murmurs, gallops, or rubs. GI: The abdomen is soft, non-distended, with localized tenderness at the umbilicus.  There is small amount of thin drainage from the umbilicus, with odor.  Trying to probe with qtips to get to the base of the umbilicus is very tender for her, but can see a small area of erythema and swelling resembling a small abscess.  Unable to perform a thorough exam for her hernia  but the overall umbilical area is soft, without induration, without any firm mass.  MUSCULOSKELETAL:  Normal muscle strength and tone in all four extremities.  No peripheral edema or cyanosis. NEUROLOGIC:  Motor and sensation is grossly normal.  Cranial nerves are grossly intact. PSYCH:  Alert and oriented to person, place and time. Affect is normal.  Laboratory Analysis: No results found for this or any previous visit (from the past 24 hours).  Imaging: Ultrasound abdomen 11/28/23: FINDINGS: Focused ultrasound  in the area of concern at the umbilicus demonstrates presence of a small hernia with maneuvers of Valsalva. This appears to be fat containing. Small amount of bowel is suggested as well on the cine images.   IMPRESSION: Umbilical hernia identified containing fat and question of some bowel. Please correlate with clinical findings and if needed additional workup with CT as clinically appropriate.  Assessment and Plan: This is a 23 y.o. female with an umbilical hernia and superficial abscess  --Discussed with the patient that she has two ongoing issues at the umbilicus.  She does have a small hernia as noted on her ultrasound.  The fascial defect appears to be small, but there is concern for possible bowel that protrudes.  For now, although could not do a thorough exam due to pain, I do not think there's any evidence of incarceration or strangulation.  The area around the umbilicus is soft, without induration or firmness.  She does have however, a superficial small abscess at the base of the umbilicus.  This could be actually what's causing her the majority of her pain rather than the hernia.   --Discussed with her that we need to treat the infection first before repairing her hernia.  Need to have a clean area if we're going to use mesh at all to prevent any mesh infection.  Once the skin infection has cleared, then we can proceed with hernia repair.   --Will start her on Augmentin  course and she will follow up with me on 12/03/23 to evaluate the progress.  If no improvement, may need to take her to the OR for I&D of the area.  At that time, we could also repair her hernia, but would be without mesh. --All of her questions have been answered.  I spent 40 minutes dedicated to the care of this patient on the date of this encounter to include pre-visit review of records, face-to-face time with the patient discussing diagnosis and management, and any post-visit coordination of care.   Aloysius Sheree Plant,  MD [facility]

## 2023-12-03 ENCOUNTER — Encounter: Payer: Self-pay | Admitting: Surgery

## 2023-12-03 ENCOUNTER — Ambulatory Visit (INDEPENDENT_AMBULATORY_CARE_PROVIDER_SITE_OTHER): Admitting: Surgery

## 2023-12-03 VITALS — BP 103/70 | HR 84 | Temp 98.2°F | Ht 63.0 in | Wt 161.0 lb

## 2023-12-03 DIAGNOSIS — K429 Umbilical hernia without obstruction or gangrene: Secondary | ICD-10-CM

## 2023-12-03 DIAGNOSIS — L02216 Cutaneous abscess of umbilicus: Secondary | ICD-10-CM | POA: Diagnosis not present

## 2023-12-03 NOTE — Progress Notes (Signed)
  12/03/2023  History of Present Illness: Karen Woodward is a 23 y.o. female presenting for follow up of a cutaneous abscess of the umbilicus and umbilical hernia.  On her last visit on 11/30/23 she was very tender in the area and had a small abscess at the base of the umbilicus.  She was started on Augmentin .  Today, she reports that the pain has improved.  Still having some drainage and pain, but better than before.  Denies any fevers, chills.  Past Medical History: Past Medical History:  Diagnosis Date   Anemia    Dysmenorrhea 05/16/2018   Menometrorrhagia 05/16/2018   Migraine with aura    Morbid obesity with BMI of 40.0-44.9, adult (HCC)    Preeclampsia    Soft tissue swelling of back 05/16/2018     Past Surgical History: Past Surgical History:  Procedure Laterality Date   TONSILLECTOMY AND ADENOIDECTOMY  06/2014    Home Medications: Prior to Admission medications   Medication Sig Start Date End Date Taking? Authorizing Provider  albuterol  (VENTOLIN  HFA) 108 (90 Base) MCG/ACT inhaler Inhale 2 puffs into the lungs every 6 (six) hours as needed for wheezing or shortness of breath. 01/03/23   Teresa Shelba SAUNDERS, NP  amoxicillin -clavulanate (AUGMENTIN ) 875-125 MG tablet Take 1 tablet by mouth 2 (two) times daily for 7 days. 11/30/23 12/07/23  Desiderio Schanz, MD    Allergies: No Known Allergies  Review of Systems: Review of Systems  Constitutional:  Negative for chills and fever.  Respiratory:  Negative for shortness of breath.   Cardiovascular:  Negative for chest pain.  Gastrointestinal:  Positive for abdominal pain. Negative for nausea and vomiting.    Physical Exam BP 103/70   Pulse 84   Temp 98.2 F (36.8 C) (Oral)   Ht 5' 3 (1.6 m)   Wt 161 lb (73 kg)   LMP 10/31/2023 (Exact Date)   SpO2 98%   BMI 28.52 kg/m  CONSTITUTIONAL: No acute distress HEENT:  Normocephalic, atraumatic, extraocular motion intact. RESPIRATORY: Normal respiratory effort without pathologic use of  accessory muscles. CARDIOVASCULAR: Regular rhythm and rate. GI: The abdomen is soft, non-distended, with improved tenderness.  Able to probe the umbilicus with less issues today.  There is still a small wound at the base, but the pain is much improved, though still tender with direct pressure with qtip. Dry gauze dressing applied. NEUROLOGIC:  Motor and sensation is grossly normal.  Cranial nerves are grossly intact. PSYCH:  Alert and oriented to person, place and time. Affect is normal.   Assessment and Plan: This is a 23 y.o. female with small abscess at the umbilicus and umbilical hernia  --Discussed with the patient that the area is much improved and her pain appears to be doing much better when examining her.  I think for now we can continue monitoring her progress.  She will follow up with me on 12/07/23.  If the area continues to improve, then we could schedule her for a formal robotic umbilical hernia repair with mesh, but if there's still residual infection, would plan for open umbilical hernia repair without mesh and also I&D of the residual abscess. --Follow up on 12/07/23.  I spent 10 minutes dedicated to the care of this patient on the date of this encounter to include pre-visit review of records, face-to-face time with the patient discussing diagnosis and management, and any post-visit coordination of care.   Schanz Sheree Desiderio, MD [facility]

## 2023-12-03 NOTE — Patient Instructions (Signed)
Please call the office if you have any questions or concerns. 

## 2023-12-05 ENCOUNTER — Ambulatory Visit: Admitting: Certified Nurse Midwife

## 2023-12-07 ENCOUNTER — Encounter: Payer: Self-pay | Admitting: Surgery

## 2023-12-07 ENCOUNTER — Ambulatory Visit (INDEPENDENT_AMBULATORY_CARE_PROVIDER_SITE_OTHER): Admitting: Surgery

## 2023-12-07 ENCOUNTER — Telehealth: Payer: Self-pay | Admitting: Surgery

## 2023-12-07 VITALS — BP 105/68 | HR 83 | Temp 98.2°F | Ht 63.0 in | Wt 158.0 lb

## 2023-12-07 DIAGNOSIS — L02216 Cutaneous abscess of umbilicus: Secondary | ICD-10-CM | POA: Diagnosis not present

## 2023-12-07 DIAGNOSIS — K429 Umbilical hernia without obstruction or gangrene: Secondary | ICD-10-CM

## 2023-12-07 MED ORDER — AMOXICILLIN-POT CLAVULANATE 875-125 MG PO TABS: 1.0000 | ORAL_TABLET | Freq: Two times a day (BID) | ORAL | 0 refills | Status: DC

## 2023-12-07 NOTE — Telephone Encounter (Signed)
 Patient has been advised of Pre-Admission date/time, and Surgery date at [facility].  Surgery Date: 12/13/23 Preadmission Testing Date: 12/11/23 (phone 1p-4p)  Patient informed of the scheduling process and surgery information given at time of office visit.   Patient has been made aware to call [phone], between 1-3:00pm the day before surgery, to find out what time to arrive for surgery.

## 2023-12-07 NOTE — Progress Notes (Signed)
 12/07/2023  History of Present Illness: Karen Woodward is a 23 y.o. female presenting for follow-up of a cutaneous abscess at the umbilicus as well as umbilical hernia.  Patient was last seen on 12/03/2023 at which time the area of infection at the umbilicus was improving with the initiation of oral antibiotics.  Today she reports that the area continues to improve.  Drainage has also decreased.  Pain is much better.  Past Medical History: Past Medical History:  Diagnosis Date   Anemia    Dysmenorrhea 05/16/2018   Menometrorrhagia 05/16/2018   Migraine with aura    Morbid obesity with BMI of 40.0-44.9, adult (HCC)    Preeclampsia    Soft tissue swelling of back 05/16/2018     Past Surgical History: Past Surgical History:  Procedure Laterality Date   TONSILLECTOMY AND ADENOIDECTOMY  06/2014    Home Medications: Prior to Admission medications   Medication Sig Start Date End Date Taking? Authorizing Provider  albuterol  (VENTOLIN  HFA) 108 (90 Base) MCG/ACT inhaler Inhale 2 puffs into the lungs every 6 (six) hours as needed for wheezing or shortness of breath. 01/03/23  Yes White, Adrienne R, NP  amoxicillin -clavulanate (AUGMENTIN ) 875-125 MG tablet Take 1 tablet by mouth 2 (two) times daily for 7 days. 12/07/23 12/14/23  Desiderio Schanz, MD    Allergies: No Known Allergies  Review of Systems: Review of Systems  Constitutional:  Negative for chills and fever.  Respiratory:  Negative for shortness of breath.   Cardiovascular:  Negative for chest pain.  Gastrointestinal:  Positive for abdominal pain (improved). Negative for nausea and vomiting.    Physical Exam BP 105/68   Pulse 83   Temp 98.2 F (36.8 C)   Ht 5' 3 (1.6 m)   Wt 158 lb (71.7 kg)   LMP 10/31/2023 (Exact Date)   SpO2 98%   BMI 27.99 kg/m  CONSTITUTIONAL: No acute distress, well-nourished HEENT:  Normocephalic, atraumatic, extraocular motion intact. RESPIRATORY:  Lungs are clear, and breath sounds are equal  bilaterally. Normal respiratory effort without pathologic use of accessory muscles. CARDIOVASCULAR: Heart is regular without murmurs, gallops, or rubs. GI: The abdomen is soft, nondistended, with mild soreness to palpation.  The patient's umbilical area was evaluated further in the area of redness noted before has improved and is lighter in color without any further ulceration like before.  No drainage that I can express at this point.  The area is less tender compared to the last exam.  I was able now to examine her umbilical hernia which is small but incarcerated.  This likely contains preperitoneal fat.  NEUROLOGIC:  Motor and sensation is grossly normal.  Cranial nerves are grossly intact. PSYCH:  Alert and oriented to person, place and time. Affect is normal.  Labs/Imaging: Ultrasound abdomen on 11/28/2023: IMPRESSION: Umbilical hernia identified containing fat and question of some bowel. Please correlate with clinical findings and if needed additional workup with CT as clinically appropriate.  Assessment and Plan: This is a 23 y.o. female with a superficial cutaneous infection at the umbilicus and umbilical hernia.  - The patient's infection is currently much better and there is only some residual soreness and residual erythema.  The skin appears to be sealing itself and no further ulceration or fibrinous material noted.  I was able to examine her hernia better and it is small but incarcerated.  Discussed with patient that given the improvement, we can continue with antibiotics and will extend the course and we should be able  to schedule her for surgery for hernia repair.  Discussed with her the possible options of open umbilical hernia repair with I&D of the umbilical area if the infection continued, versus robotic assisted umbilical hernia repair with either permanent or phasix mesh depending on how her umbilical area looks on the day of surgery.  She is in agreement with this. - Discussed  with her then the plan for potential surgical options but for now we will tentatively schedule her for robotic assisted umbilical hernia repair.  Reviewed the surgery at length with her including the planned incisions, risks of bleeding, infection, injury to surrounding structures, the differences between the 2 mesh that could be used, that this will be an outpatient procedure, postoperative activity restrictions, pain control, and she is willing to proceed. - We will schedule her for surgery on 12/13/2023 based on her scheduled preference.  All of her questions have been answered.  I spent 30 minutes dedicated to the care of this patient on the date of this encounter to include pre-visit review of records, face-to-face time with the patient discussing diagnosis and management, and any post-visit coordination of care.   Aloysius Sheree Plant, MD [facility]

## 2023-12-07 NOTE — H&P (View-Only) (Signed)
 12/07/2023  History of Present Illness: Karen Woodward is a 23 y.o. female presenting for follow-up of a cutaneous abscess at the umbilicus as well as umbilical hernia.  Patient was last seen on 12/03/2023 at which time the area of infection at the umbilicus was improving with the initiation of oral antibiotics.  Today she reports that the area continues to improve.  Drainage has also decreased.  Pain is much better.  Past Medical History: Past Medical History:  Diagnosis Date   Anemia    Dysmenorrhea 05/16/2018   Menometrorrhagia 05/16/2018   Migraine with aura    Morbid obesity with BMI of 40.0-44.9, adult (HCC)    Preeclampsia    Soft tissue swelling of back 05/16/2018     Past Surgical History: Past Surgical History:  Procedure Laterality Date   TONSILLECTOMY AND ADENOIDECTOMY  06/2014    Home Medications: Prior to Admission medications   Medication Sig Start Date End Date Taking? Authorizing Provider  albuterol  (VENTOLIN  HFA) 108 (90 Base) MCG/ACT inhaler Inhale 2 puffs into the lungs every 6 (six) hours as needed for wheezing or shortness of breath. 01/03/23  Yes White, Adrienne R, NP  amoxicillin -clavulanate (AUGMENTIN ) 875-125 MG tablet Take 1 tablet by mouth 2 (two) times daily for 7 days. 12/07/23 12/14/23  Desiderio Schanz, MD    Allergies: No Known Allergies  Review of Systems: Review of Systems  Constitutional:  Negative for chills and fever.  Respiratory:  Negative for shortness of breath.   Cardiovascular:  Negative for chest pain.  Gastrointestinal:  Positive for abdominal pain (improved). Negative for nausea and vomiting.    Physical Exam BP 105/68   Pulse 83   Temp 98.2 F (36.8 C)   Ht 5' 3 (1.6 m)   Wt 158 lb (71.7 kg)   LMP 10/31/2023 (Exact Date)   SpO2 98%   BMI 27.99 kg/m  CONSTITUTIONAL: No acute distress, well-nourished HEENT:  Normocephalic, atraumatic, extraocular motion intact. RESPIRATORY:  Lungs are clear, and breath sounds are equal  bilaterally. Normal respiratory effort without pathologic use of accessory muscles. CARDIOVASCULAR: Heart is regular without murmurs, gallops, or rubs. GI: The abdomen is soft, nondistended, with mild soreness to palpation.  The patient's umbilical area was evaluated further in the area of redness noted before has improved and is lighter in color without any further ulceration like before.  No drainage that I can express at this point.  The area is less tender compared to the last exam.  I was able now to examine her umbilical hernia which is small but incarcerated.  This likely contains preperitoneal fat.  NEUROLOGIC:  Motor and sensation is grossly normal.  Cranial nerves are grossly intact. PSYCH:  Alert and oriented to person, place and time. Affect is normal.  Labs/Imaging: Ultrasound abdomen on 11/28/2023: IMPRESSION: Umbilical hernia identified containing fat and question of some bowel. Please correlate with clinical findings and if needed additional workup with CT as clinically appropriate.  Assessment and Plan: This is a 23 y.o. female with a superficial cutaneous infection at the umbilicus and umbilical hernia.  - The patient's infection is currently much better and there is only some residual soreness and residual erythema.  The skin appears to be sealing itself and no further ulceration or fibrinous material noted.  I was able to examine her hernia better and it is small but incarcerated.  Discussed with patient that given the improvement, we can continue with antibiotics and will extend the course and we should be able  to schedule her for surgery for hernia repair.  Discussed with her the possible options of open umbilical hernia repair with I&D of the umbilical area if the infection continued, versus robotic assisted umbilical hernia repair with either permanent or phasix mesh depending on how her umbilical area looks on the day of surgery.  She is in agreement with this. - Discussed  with her then the plan for potential surgical options but for now we will tentatively schedule her for robotic assisted umbilical hernia repair.  Reviewed the surgery at length with her including the planned incisions, risks of bleeding, infection, injury to surrounding structures, the differences between the 2 mesh that could be used, that this will be an outpatient procedure, postoperative activity restrictions, pain control, and she is willing to proceed. - We will schedule her for surgery on 12/13/2023 based on her scheduled preference.  All of her questions have been answered.  I spent 30 minutes dedicated to the care of this patient on the date of this encounter to include pre-visit review of records, face-to-face time with the patient discussing diagnosis and management, and any post-visit coordination of care.   Aloysius Sheree Plant, MD [facility]

## 2023-12-07 NOTE — Patient Instructions (Signed)
 We will extend your antibiotics for 7 more days. We will schedule you for surgery.  You have requested for your Umbilical Hernia be repaired. This will be scheduled with Dr. Desiderio at [facility].   Please see your (blue)pre-care sheet for information. Our surgery scheduler will call you to verify surgery date and to go over information.   You will need to arrange to be off work for 1-2 weeks but will have to have a lifting restriction of no more than 15 lbs for 6 weeks following your surgery. If you have FMLA or disability paperwork that needs filled out you may drop this off at our office or this can be faxed to [phone].     Umbilical Hernia, Adult A hernia is a bulge of tissue that pushes through an opening between muscles. An umbilical hernia happens in the abdomen, near the belly button (umbilicus). The hernia may contain tissues from the small intestine, large intestine, or fatty tissue covering the intestines (omentum). Umbilical hernias in adults tend to get worse over time, and they require surgical treatment. There are several types of umbilical hernias. You may have: A hernia located just above or below the umbilicus (indirect hernia). This is the most common type of umbilical hernia in adults. A hernia that forms through an opening formed by the umbilicus (direct hernia). A hernia that comes and goes (reducible hernia). A reducible hernia may be visible only when you strain, lift something heavy, or cough. This type of hernia can be pushed back into the abdomen (reduced). A hernia that traps abdominal tissue inside the hernia (incarcerated hernia). This type of hernia cannot be reduced. A hernia that cuts off blood flow to the tissues inside the hernia (strangulated hernia). The tissues can start to die if this happens. This type of hernia requires emergency treatment.  What are the causes? An umbilical hernia happens when tissue inside the abdomen  presses on a weak area of the abdominal muscles. What increases the risk? You may have a greater risk of this condition if you: Are obese. Have had several pregnancies. Have a buildup of fluid inside your abdomen (ascites). Have had surgery that weakens the abdominal muscles.  What are the signs or symptoms? The main symptom of this condition is a painless bulge at or near the belly button. A reducible hernia may be visible only when you strain, lift something heavy, or cough. Other symptoms may include: Dull pain. A feeling of pressure.  Symptoms of a strangulated hernia may include: Pain that gets increasingly worse. Nausea and vomiting. Pain when pressing on the hernia. Skin over the hernia becoming red or purple. Constipation. Blood in the stool.  How is this diagnosed? This condition may be diagnosed based on: A physical exam. You may be asked to cough or strain while standing. These actions increase the pressure inside your abdomen and force the hernia through the opening in your muscles. Your health care provider may try to reduce the hernia by pressing on it. Your symptoms and medical history.  How is this treated? Surgery is the only treatment for an umbilical hernia. Surgery for a strangulated hernia is done as soon as possible. If you have a small hernia that is not incarcerated, you may need to lose weight before having surgery. Follow these instructions at home: Lose weight, if told by your health care provider. Do not try to push the hernia back in. Watch your hernia for any changes in color or  size. Tell your health care provider if any changes occur. You may need to avoid activities that increase pressure on your hernia. Do not lift anything that is heavier than 10 lb (4.5 kg) until your health care provider says that this is safe. Take over-the-counter and prescription medicines only as told by your health care provider. Keep all follow-up visits as told by your  health care provider. This is important. Contact a health care provider if: Your hernia gets larger. Your hernia becomes painful. Get help right away if: You develop sudden, severe pain near the area of your hernia. You have pain as well as nausea or vomiting. You have pain and the skin over your hernia changes color. You develop a fever. This information is not intended to replace advice given to you by your health care provider. Make sure you discuss any questions you have with your health care provider. Document Released: 10/29/2015 Document Revised: 01/30/2016 Document Reviewed: 10/29/2015 Elsevier Interactive Patient Education  [employer].

## 2023-12-11 ENCOUNTER — Other Ambulatory Visit: Payer: Self-pay

## 2023-12-11 ENCOUNTER — Encounter
Admission: RE | Admit: 2023-12-11 | Discharge: 2023-12-11 | Disposition: A | Discharge status: Home / Self Care | Source: Ambulatory Visit | Attending: Surgery | Admitting: Surgery

## 2023-12-11 VITALS — Ht 63.0 in | Wt 158.0 lb

## 2023-12-11 DIAGNOSIS — Z01812 Encounter for preprocedural laboratory examination: Secondary | ICD-10-CM

## 2023-12-11 HISTORY — DX: Umbilical hernia without obstruction or gangrene: K42.9

## 2023-12-11 HISTORY — DX: Vitamin D deficiency, unspecified: E55.9

## 2023-12-11 NOTE — Patient Instructions (Addendum)
 Your procedure is scheduled on: Thursday, July 3 Report to the Registration Desk on the 1st floor of the [facility] To find out your arrival time, please call [phone] between 1PM - 3PM on: Wednesday, July 2 If your arrival time is 6:00 am, do not arrive before that time as the [facility] entrance doors do not open until 6:00 am.  REMEMBER: Instructions that are not followed completely may result in serious medical risk, up to and including death; or upon the discretion of your surgeon and anesthesiologist your surgery may need to be rescheduled.  Do not eat food after midnight the night before surgery.  No gum chewing or hard candies.  You may however, drink CLEAR liquids up to 2 hours before you are scheduled to arrive for your surgery. Do not drink anything within 2 hours of your scheduled arrival time.  Clear liquids include: - water  - apple juice without pulp - gatorade (not RED colors) - black coffee or tea (Do NOT add milk or creamers to the coffee or tea) Do NOT drink anything that is not on this list.  One week prior to surgery:  Stop Anti-inflammatories (NSAIDS) such as Advil , Aleve, Ibuprofen , Motrin , Naproxen, Naprosyn and Aspirin  based products such as Excedrin, Goody's Powder, BC Powder. Stop ANY OVER THE COUNTER supplements until after surgery.  You may however, continue to take Tylenol  if needed for pain up until the day of surgery.  Continue taking all of your other prescription medications up until the day of surgery.  ON THE DAY OF SURGERY ONLY TAKE THESE MEDICATIONS WITH SIPS OF WATER:  amoxicillin -clavulanate (AUGMENTIN )   Bring your albuterol  inhaler to the hospital to use before surgery.  No Alcohol for 24 hours before or after surgery.  No Smoking including e-cigarettes for 24 hours before surgery.  No chewable tobacco products for at least 6 hours before surgery.  No nicotine patches on the day of surgery.  Do not use any recreational  drugs for at least a week (preferably 2 weeks) before your surgery.  Please be advised that the combination of cocaine and anesthesia may have negative outcomes, up to and including death. If you test positive for cocaine, your surgery will be cancelled.  On the morning of surgery brush your teeth with toothpaste and water, you may rinse your mouth with mouthwash if you wish. Do not swallow any toothpaste or mouthwash.  Use CHG Soap as directed on instruction sheet.  Do not wear jewelry, make-up, hairpins, clips or nail polish.  For welded (permanent) jewelry: bracelets, anklets, waist bands, etc.  Please have this removed prior to surgery.  If it is not removed, there is a chance that hospital personnel will need to cut it off on the day of surgery.  Do not wear lotions, powders, or perfumes.   Do not shave body hair from the neck down 48 hours before surgery.  Contact lenses, hearing aids and dentures may not be worn into surgery.  Do not bring valuables to the hospital. [facility] is not responsible for any missing/lost belongings or valuables.   Notify your doctor if there is any change in your medical condition (cold, fever, infection).  Wear comfortable clothing (specific to your surgery type) to the hospital.  After surgery, you can help prevent lung complications by doing breathing exercises.  Take deep breaths and cough every 1-2 hours. Your doctor may order a device called an Incentive Spirometer to help you take deep breaths. When coughing  or sneezing, hold a pillow firmly against your incision with both hands. This is called "splinting." Doing this helps protect your incision. It also decreases belly discomfort.  If you are being discharged the day of surgery, you will not be allowed to drive home. You will need a responsible individual to drive you home and stay with you for 24 hours after surgery.   If you are taking public transportation, you will need to have a  responsible individual with you.  Please call the Pre-admissions Testing Dept. at [phone] if you have any questions about these instructions.  Surgery Visitation Policy:  Patients having surgery or a procedure may have two visitors.  Children under the age of 7 must have an adult with them who is not the patient.   Merchandiser, retail to address health-related social needs:  [email or web address]      Preparing for Surgery with CHLORHEXIDINE GLUCONATE (CHG) Soap  Chlorhexidine Gluconate (CHG) Soap  o An antiseptic cleaner that kills germs and bonds with the skin to continue killing germs even after washing  o Used for showering the night before surgery and morning of surgery  Before surgery, you can play an important role by reducing the number of germs on your skin.  CHG (Chlorhexidine gluconate) soap is an antiseptic cleanser which kills germs and bonds with the skin to continue killing germs even after washing.  Please do not use if you have an allergy to CHG or antibacterial soaps. If your skin becomes reddened/irritated stop using the CHG.  1. Shower the NIGHT BEFORE SURGERY and the MORNING OF SURGERY with CHG soap.  2. If you choose to wash your hair, wash your hair first as usual with your normal shampoo.  3. After shampooing, rinse your hair and body thoroughly to remove the shampoo.  4. Use CHG as you would any other liquid soap. You can apply CHG directly to the skin and wash gently with a scrungie or a clean washcloth.  5. Apply the CHG soap to your body only from the neck down. Do not use on open wounds or open sores. Avoid contact with your eyes, ears, mouth, and genitals (private parts). Wash face and genitals (private parts) with your normal soap.  6. Wash thoroughly, paying special attention to the area where your surgery will be performed.  7. Thoroughly rinse your body with warm water.  8. Do not shower/wash with your normal  soap after using and rinsing off the CHG soap.  9. Pat yourself dry with a clean towel.  10. Wear clean pajamas to bed the night before surgery.  12. Place clean sheets on your bed the night of your first shower and do not sleep with pets.  13. Shower again with the CHG soap on the day of surgery prior to arriving at the hospital.  14. Do not apply any deodorants/lotions/powders.  15. Please wear clean clothes to the hospital.

## 2023-12-12 MED ORDER — ORAL CARE MOUTH RINSE: 15.0000 mL | Freq: Once | OROMUCOSAL | Status: AC

## 2023-12-12 MED ORDER — BUPIVACAINE LIPOSOME 1.3 % IJ SUSP: 20.0000 mL | Freq: Once | INTRAMUSCULAR | Status: DC

## 2023-12-12 MED ORDER — GABAPENTIN 300 MG PO CAPS
300.0000 mg | ORAL_CAPSULE | ORAL | Status: AC
  Administered 2023-12-13: 300 mg via ORAL

## 2023-12-12 MED ORDER — CHLORHEXIDINE GLUCONATE CLOTH 2 % EX PADS: 6.0000 | MEDICATED_PAD | Freq: Once | CUTANEOUS | Status: DC

## 2023-12-12 MED ORDER — CEFAZOLIN SODIUM-DEXTROSE 2-4 GM/100ML-% IV SOLN
2.0000 g | INTRAVENOUS | Status: AC
  Administered 2023-12-13: 2 g via INTRAVENOUS

## 2023-12-12 MED ORDER — LACTATED RINGERS IV SOLN: INTRAVENOUS | Status: DC

## 2023-12-12 MED ORDER — ACETAMINOPHEN 500 MG PO TABS
1000.0000 mg | ORAL_TABLET | ORAL | Status: AC
  Administered 2023-12-13: 1000 mg via ORAL

## 2023-12-12 MED ORDER — CHLORHEXIDINE GLUCONATE CLOTH 2 % EX PADS
6.0000 | MEDICATED_PAD | Freq: Once | CUTANEOUS | Status: AC
  Administered 2023-12-13: 6 via TOPICAL

## 2023-12-12 MED ORDER — CHLORHEXIDINE GLUCONATE 0.12 % MT SOLN
15.0000 mL | Freq: Once | OROMUCOSAL | Status: AC
  Administered 2023-12-13: 15 mL via OROMUCOSAL

## 2023-12-13 ENCOUNTER — Encounter
Admission: RE | Disposition: A | Discharge status: Home / Self Care | Payer: Self-pay | Source: Home / Self Care | Attending: Surgery

## 2023-12-13 ENCOUNTER — Ambulatory Visit
Admission: RE | Admit: 2023-12-13 | Discharge: 2023-12-13 | Disposition: A | Discharge status: Home / Self Care | Attending: Surgery | Admitting: Surgery

## 2023-12-13 ENCOUNTER — Other Ambulatory Visit: Payer: Self-pay

## 2023-12-13 ENCOUNTER — Encounter: Payer: Self-pay | Admitting: Surgery

## 2023-12-13 ENCOUNTER — Ambulatory Visit: Admitting: Certified Registered"

## 2023-12-13 DIAGNOSIS — K420 Umbilical hernia with obstruction, without gangrene: Secondary | ICD-10-CM | POA: Insufficient documentation

## 2023-12-13 DIAGNOSIS — K429 Umbilical hernia without obstruction or gangrene: Secondary | ICD-10-CM

## 2023-12-13 DIAGNOSIS — G43909 Migraine, unspecified, not intractable, without status migrainosus: Secondary | ICD-10-CM | POA: Diagnosis not present

## 2023-12-13 DIAGNOSIS — D649 Anemia, unspecified: Secondary | ICD-10-CM | POA: Insufficient documentation

## 2023-12-13 DIAGNOSIS — I10 Essential (primary) hypertension: Secondary | ICD-10-CM | POA: Insufficient documentation

## 2023-12-13 DIAGNOSIS — Z6827 Body mass index (BMI) 27.0-27.9, adult: Secondary | ICD-10-CM | POA: Insufficient documentation

## 2023-12-13 DIAGNOSIS — L02216 Cutaneous abscess of umbilicus: Secondary | ICD-10-CM | POA: Diagnosis not present

## 2023-12-13 DIAGNOSIS — Z01812 Encounter for preprocedural laboratory examination: Secondary | ICD-10-CM

## 2023-12-13 LAB — POCT PREGNANCY, URINE: Preg Test, Ur: NEGATIVE

## 2023-12-13 SURGERY — REPAIR, HERNIA, UMBILICAL, ROBOT-ASSISTED
Anesthesia: General

## 2023-12-13 MED ORDER — OXYCODONE HCL 5 MG/5ML PO SOLN: 5.0000 mg | Freq: Once | ORAL | Status: AC | PRN

## 2023-12-13 MED ORDER — ACETAMINOPHEN 500 MG PO TABS
ORAL_TABLET | ORAL | Status: AC
  Filled 2023-12-13: qty 2

## 2023-12-13 MED ORDER — MIDAZOLAM HCL 2 MG/2ML IJ SOLN
INTRAMUSCULAR | Status: AC
  Filled 2023-12-13: qty 2

## 2023-12-13 MED ORDER — CHLORHEXIDINE GLUCONATE 0.12 % MT SOLN
OROMUCOSAL | Status: AC
  Filled 2023-12-13: qty 15

## 2023-12-13 MED ORDER — IBUPROFEN 600 MG PO TABS
600.0000 mg | ORAL_TABLET | Freq: Three times a day (TID) | ORAL | 1 refills | Status: DC | PRN
Start: 2023-12-13 — End: 2023-12-26

## 2023-12-13 MED ORDER — 0.9 % SODIUM CHLORIDE (POUR BTL) OPTIME
TOPICAL | Status: DC | PRN
  Administered 2023-12-13: 500 mL

## 2023-12-13 MED ORDER — OXYCODONE HCL 5 MG PO TABS: 5.0000 mg | ORAL_TABLET | ORAL | 0 refills | Status: DC | PRN

## 2023-12-13 MED ORDER — ROCURONIUM BROMIDE 100 MG/10ML IV SOLN
INTRAVENOUS | Status: DC | PRN
  Administered 2023-12-13: 50 mg via INTRAVENOUS

## 2023-12-13 MED ORDER — AMOXICILLIN-POT CLAVULANATE 875-125 MG PO TABS: 1.0000 | ORAL_TABLET | Freq: Two times a day (BID) | ORAL | 0 refills | Status: AC

## 2023-12-13 MED ORDER — BUPIVACAINE-EPINEPHRINE (PF) 0.5% -1:200000 IJ SOLN
INTRAMUSCULAR | Status: AC
Start: 2023-12-13 — End: 2023-12-13
  Filled 2023-12-13: qty 10

## 2023-12-13 MED ORDER — FENTANYL CITRATE (PF) 100 MCG/2ML IJ SOLN
25.0000 ug | INTRAMUSCULAR | Status: DC | PRN
  Administered 2023-12-13 (×4): 25 ug via INTRAVENOUS

## 2023-12-13 MED ORDER — BUPIVACAINE LIPOSOME 1.3 % IJ SUSP
INTRAMUSCULAR | Status: AC
  Filled 2023-12-13: qty 20

## 2023-12-13 MED ORDER — OXYCODONE HCL 5 MG PO TABS
ORAL_TABLET | ORAL | Status: AC
  Filled 2023-12-13: qty 1

## 2023-12-13 MED ORDER — PROPOFOL 10 MG/ML IV BOLUS
INTRAVENOUS | Status: AC
  Filled 2023-12-13: qty 20

## 2023-12-13 MED ORDER — FENTANYL CITRATE (PF) 100 MCG/2ML IJ SOLN
INTRAMUSCULAR | Status: DC | PRN
  Administered 2023-12-13 (×2): 50 ug via INTRAVENOUS

## 2023-12-13 MED ORDER — FENTANYL CITRATE (PF) 100 MCG/2ML IJ SOLN
INTRAMUSCULAR | Status: AC
  Filled 2023-12-13: qty 2

## 2023-12-13 MED ORDER — BUPIVACAINE LIPOSOME 1.3 % IJ SUSP
INTRAMUSCULAR | Status: DC | PRN
  Administered 2023-12-13: 10 mL

## 2023-12-13 MED ORDER — ACETAMINOPHEN 500 MG PO TABS: 1000.0000 mg | ORAL_TABLET | Freq: Four times a day (QID) | ORAL | Status: DC | PRN

## 2023-12-13 MED ORDER — LIDOCAINE HCL (CARDIAC) PF 100 MG/5ML IV SOSY
PREFILLED_SYRINGE | INTRAVENOUS | Status: DC | PRN
  Administered 2023-12-13: 80 mg via INTRAVENOUS

## 2023-12-13 MED ORDER — ONDANSETRON HCL 4 MG/2ML IJ SOLN: 4.0000 mg | Freq: Once | INTRAMUSCULAR | Status: DC | PRN

## 2023-12-13 MED ORDER — CEFAZOLIN SODIUM-DEXTROSE 2-4 GM/100ML-% IV SOLN
INTRAVENOUS | Status: AC
  Filled 2023-12-13: qty 100

## 2023-12-13 MED ORDER — PHENYLEPHRINE 80 MCG/ML (10ML) SYRINGE FOR IV PUSH (FOR BLOOD PRESSURE SUPPORT)
PREFILLED_SYRINGE | INTRAVENOUS | Status: DC | PRN
  Administered 2023-12-13: 80 ug via INTRAVENOUS

## 2023-12-13 MED ORDER — LACTATED RINGERS IV SOLN: INTRAVENOUS | Status: DC | PRN

## 2023-12-13 MED ORDER — GABAPENTIN 300 MG PO CAPS
ORAL_CAPSULE | ORAL | Status: AC
Start: 2023-12-13 — End: 2023-12-13
  Filled 2023-12-13: qty 1

## 2023-12-13 MED ORDER — DEXMEDETOMIDINE HCL IN NACL 80 MCG/20ML IV SOLN
INTRAVENOUS | Status: DC | PRN
  Administered 2023-12-13: 8 ug via INTRAVENOUS
  Administered 2023-12-13: 4 ug via INTRAVENOUS
  Administered 2023-12-13: 8 ug via INTRAVENOUS

## 2023-12-13 MED ORDER — OXYCODONE HCL 5 MG PO TABS
5.0000 mg | ORAL_TABLET | Freq: Once | ORAL | Status: AC | PRN
  Administered 2023-12-13: 5 mg via ORAL

## 2023-12-13 MED ORDER — DEXAMETHASONE SODIUM PHOSPHATE 10 MG/ML IJ SOLN
INTRAMUSCULAR | Status: DC | PRN
  Administered 2023-12-13: 10 mg via INTRAVENOUS

## 2023-12-13 MED ORDER — SUCCINYLCHOLINE CHLORIDE 200 MG/10ML IV SOSY
PREFILLED_SYRINGE | INTRAVENOUS | Status: DC | PRN
  Administered 2023-12-13: 120 mg via INTRAVENOUS

## 2023-12-13 MED ORDER — SUGAMMADEX SODIUM 200 MG/2ML IV SOLN
INTRAVENOUS | Status: DC | PRN
  Administered 2023-12-13: 200 mg via INTRAVENOUS

## 2023-12-13 MED ORDER — PROPOFOL 10 MG/ML IV BOLUS
INTRAVENOUS | Status: DC | PRN
  Administered 2023-12-13: 200 mg via INTRAVENOUS

## 2023-12-13 MED ORDER — ONDANSETRON HCL 4 MG/2ML IJ SOLN
INTRAMUSCULAR | Status: DC | PRN
  Administered 2023-12-13: 4 mg via INTRAVENOUS

## 2023-12-13 MED ORDER — MIDAZOLAM HCL 2 MG/2ML IJ SOLN
INTRAMUSCULAR | Status: DC | PRN
  Administered 2023-12-13: 2 mg via INTRAVENOUS

## 2023-12-13 MED ORDER — BUPIVACAINE-EPINEPHRINE 0.5% -1:200000 IJ SOLN
INTRAMUSCULAR | Status: DC | PRN
  Administered 2023-12-13: 20 mL

## 2023-12-13 MED ORDER — KETOROLAC TROMETHAMINE 30 MG/ML IJ SOLN
INTRAMUSCULAR | Status: DC | PRN
Start: 2023-12-13 — End: 2023-12-13
  Administered 2023-12-13: 30 mg via INTRAVENOUS

## 2023-12-13 SURGICAL SUPPLY — 48 items
CANNULA CAP OBTURATR AIRSEAL 8 (CAP) IMPLANT
COVER TIP SHEARS 8 DVNC (MISCELLANEOUS) ×2 IMPLANT
COVER WAND RF STERILE (DRAPES) ×2 IMPLANT
DERMABOND ADVANCED .7 DNX12 (GAUZE/BANDAGES/DRESSINGS) ×2 IMPLANT
DRAPE ARM DVNC X/XI (DISPOSABLE) ×6 IMPLANT
DRAPE COLUMN DVNC XI (DISPOSABLE) ×2 IMPLANT
ELECTRODE CAUTERY BLDE TIP 2.5 (TIP) ×2 IMPLANT
ELECTRODE REM PT RTRN 9FT ADLT (ELECTROSURGICAL) ×2 IMPLANT
FORCEPS BPLR R/ABLATION 8 DVNC (INSTRUMENTS) ×2 IMPLANT
GLOVE SURG SYN 7.0 (GLOVE) ×2 IMPLANT
GLOVE SURG SYN 7.0 PF PI (GLOVE) ×4 IMPLANT
GLOVE SURG SYN 7.5 E (GLOVE) ×2 IMPLANT
GLOVE SURG SYN 7.5 PF PI (GLOVE) ×4 IMPLANT
GOWN STRL REUS W/ TWL LRG LVL3 (GOWN DISPOSABLE) ×6 IMPLANT
GRASPER SUT TROCAR 14GX15 (MISCELLANEOUS) ×2 IMPLANT
IRRIGATION STRYKERFLOW (MISCELLANEOUS) IMPLANT
IV NS 1000ML BAXH (IV SOLUTION) IMPLANT
KIT PINK PAD W/HEAD ARM REST (MISCELLANEOUS) ×2 IMPLANT
LABEL OR SOLS (LABEL) ×2 IMPLANT
MANIFOLD NEPTUNE II (INSTRUMENTS) ×2 IMPLANT
NDL DRIVE SUT CUT DVNC (INSTRUMENTS) ×2 IMPLANT
NDL HYPO 22X1.5 SAFETY MO (MISCELLANEOUS) ×2 IMPLANT
NDL INSUFFLATION 14GA 120MM (NEEDLE) ×2 IMPLANT
NEEDLE DRIVE SUT CUT DVNC (INSTRUMENTS) IMPLANT
NEEDLE HYPO 22X1.5 SAFETY MO (MISCELLANEOUS) ×1 IMPLANT
NEEDLE INSUFFLATION 14GA 120MM (NEEDLE) IMPLANT
OBTURATOR OPTICALSTD 8 DVNC (TROCAR) ×2 IMPLANT
PACK LAP CHOLECYSTECTOMY (MISCELLANEOUS) ×2 IMPLANT
SCISSORS MNPLR CVD DVNC XI (INSTRUMENTS) ×2 IMPLANT
SEAL UNIV 5-12 XI (MISCELLANEOUS) ×4 IMPLANT
SET TUBE FILTERED XL AIRSEAL (SET/KITS/TRAYS/PACK) IMPLANT
SET TUBE SMOKE EVAC HIGH FLOW (TUBING) ×2 IMPLANT
SOLUTION ELECTROSURG ANTI STCK (MISCELLANEOUS) ×2 IMPLANT
SPONGE T-LAP 18X18 ~~LOC~~+RFID (SPONGE) IMPLANT
SPONGE VERSALON 4X4 4PLY (MISCELLANEOUS) IMPLANT
SUT ETHIBOND NAB MO 7 #0 18IN (SUTURE) IMPLANT
SUT STRATA 2-0 30 CT-2 (SUTURE) ×4 IMPLANT
SUT STRATAFIX PDS 30 CT-1 (SUTURE) ×2 IMPLANT
SUT VIC AB 2-0 SH 27XBRD (SUTURE) ×2 IMPLANT
SUT VIC AB 3-0 SH 27X BRD (SUTURE) ×2 IMPLANT
SUT VICRYL 0 UR6 27IN ABS (SUTURE) ×4 IMPLANT
SUTURE MNCRL 4-0 27XMF (SUTURE) ×2 IMPLANT
SYR BULB IRRIG 60ML STRL (SYRINGE) IMPLANT
SYSTEM BAG RETRIEVAL 10MM (BASKET) IMPLANT
TAPE TRANSPORE STRL 2 31045 (GAUZE/BANDAGES/DRESSINGS) ×2 IMPLANT
TRAP FLUID SMOKE EVACUATOR (MISCELLANEOUS) ×2 IMPLANT
TRAY FOLEY SLVR 16FR LF STAT (SET/KITS/TRAYS/PACK) ×2 IMPLANT
WATER STERILE IRR 500ML POUR (IV SOLUTION) ×2 IMPLANT

## 2023-12-13 NOTE — Anesthesia Preprocedure Evaluation (Signed)
 Anesthesia Evaluation  Patient identified by MRN, date of birth, ID band Patient awake    Reviewed: Allergy & Precautions, NPO status , Patient's Chart, lab work & pertinent test results  Airway Mallampati: II  TM Distance: <3 FB Neck ROM: Full    Dental  (+) Teeth Intact   Pulmonary neg pulmonary ROS, Patient abstained from smoking.   Pulmonary exam normal        Cardiovascular hypertension, Pt. on medications negative cardio ROS Normal cardiovascular exam Rhythm:Regular Rate:Normal     Neuro/Psych  Headaches negative neurological ROS  negative psych ROS   GI/Hepatic negative GI ROS, Neg liver ROS,,,  Endo/Other  negative endocrine ROSdiabetes    Renal/GU negative Renal ROS  negative genitourinary   Musculoskeletal   Abdominal   Peds negative pediatric ROS (+)  Hematology negative hematology ROS (+) Blood dyscrasia, anemia   Anesthesia Other Findings Past Medical History: No date: Anemia 05/16/2018: Dysmenorrhea 05/16/2018: Menometrorrhagia No date: Migraine with aura No date: Morbid obesity with BMI of 40.0-44.9, adult (HCC) No date: Postpartum hemorrhage     Comment:  requiring blood transfusions No date: Preeclampsia 05/16/2018: Soft tissue swelling of back No date: Umbilical hernia No date: Vitamin D deficiency  Past Surgical History: 06/2014: TONSILLECTOMY AND ADENOIDECTOMY  BMI    Body Mass Index: 27.99 kg/m      Reproductive/Obstetrics negative OB ROS                              Anesthesia Physical Anesthesia Plan  ASA: 2  Anesthesia Plan: General   Post-op Pain Management:    Induction: Intravenous  PONV Risk Score and Plan: Ondansetron , Dexamethasone, Midazolam and Treatment may vary due to age or medical condition  Airway Management Planned: Oral ETT  Additional Equipment:   Intra-op Plan:   Post-operative Plan: Extubation in OR  Informed  Consent: I have reviewed the patients History and Physical, chart, labs and discussed the procedure including the risks, benefits and alternatives for the proposed anesthesia with the patient or authorized representative who has indicated his/her understanding and acceptance.     Dental Advisory Given  Plan Discussed with: CRNA  Anesthesia Plan Comments:         Anesthesia Quick Evaluation

## 2023-12-13 NOTE — Anesthesia Postprocedure Evaluation (Signed)
 Anesthesia Post Note  Patient: Karen Woodward  Procedure(s) Performed: REPAIR, HERNIA, UMBILICAL, ROBOT-ASSISTED  Patient location during evaluation: PACU Anesthesia Type: General Level of consciousness: awake and awake and alert Pain management: satisfactory to patient Vital Signs Assessment: post-procedure vital signs reviewed and stable Respiratory status: spontaneous breathing Cardiovascular status: blood pressure returned to baseline Anesthetic complications: no   No notable events documented.   Last Vitals:  Vitals:   12/13/23 1415 12/13/23 1420  BP: 102/68   Pulse: 73 71  Resp: 18 19  Temp:    SpO2: 100% 100%    Last Pain:  Vitals:   12/13/23 1420  TempSrc:   PainSc: 5                  VAN STAVEREN,Krista Som

## 2023-12-13 NOTE — Interval H&P Note (Signed)
 History and Physical Interval Note:  12/13/2023 12:34 PM  Karen Woodward  has presented today for surgery, with the diagnosis of umbilical hernia less 3 cm incarcerated.  The various methods of treatment have been discussed with the patient and family. After consideration of risks, benefits and other options for treatment, the patient has consented to  Procedure(s): OPEN UMBILICAL HERNIA REPAIR as a surgical intervention.  The patient's history has been reviewed, patient examined, no change in status, stable for surgery.  I have reviewed the patient's chart and labs.  Questions were answered to the patient's satisfaction.     [name]

## 2023-12-13 NOTE — Transfer of Care (Signed)
 Immediate Anesthesia Transfer of Care Note  Patient: Karen Woodward  Procedure(s) Performed: REPAIR, HERNIA, UMBILICAL, ROBOT-ASSISTED  Patient Location: PACU  Anesthesia Type:General  Level of Consciousness: drowsy and patient cooperative  Airway & Oxygen Therapy: Patient Spontanous Breathing and Patient connected to face mask oxygen  Post-op Assessment: Report given to RN, Post -op Vital signs reviewed and stable, and Patient moving all extremities X 4  Post vital signs: Reviewed and stable  Last Vitals:  Vitals Value Taken Time  BP 94/53 12/13/23 13:54  Temp    Pulse 71 12/13/23 13:57  Resp 13 12/13/23 13:57  SpO2 100 % 12/13/23 13:57  Vitals shown include unfiled device data.  Last Pain:  Vitals:   12/13/23 1200  TempSrc: Temporal  PainSc: 0-No pain         Complications: No notable events documented.

## 2023-12-13 NOTE — Discharge Instructions (Signed)
 Discharge Instructions: 1.  Patient may shower, but do not scrub wounds heavily and dab dry only. 2.  Do not submerge wounds in pool/tub until fully healed. 3.  Do not apply ointments or hydrogen peroxide to the wounds. 4.  May apply ice packs to the wounds for comfort. 5.  Please place a 4x4 gauze piece into the belly button and secure with tape.  Please change once daily and as needed to keep the area clean/dry.  The umbilical wound at the base will heal on its own.  Once there's no more drainage, ok to stop the dressings. 6.  Do not drive while taking narcotics for pain control.  Prior to driving, make sure you are able to rotate right and left to look at blindspots without significant pain or discomfort. 7.  No heavy lifting or pushing of more than 10-15 lbs for 6 weeks.

## 2023-12-13 NOTE — Op Note (Signed)
 Procedure Date:  12/13/2023  Pre-operative Diagnosis:  Incarcerated Umbilical hernia and umbilical skin abscess  Post-operative Diagnosis: Incarcerated Umbilical hernia 1 cm reducible, umbilical skin abscess  Procedure:  Incarcerated Umbilical hernia repair and I&D of umbilical skin abscess  Surgeon:  Aloysius Sheree Plant, MD  Anesthesia:  General endotracheal  Estimated Blood Loss:  10 ml  Specimens:  None  Complications:  None  Indications for Procedure:  This is a 23 y.o. female who presents with an incarcerated umbilical hernia, but also with a small cutaneous abscess of the umbilical skin.  She was treated with antibiotics but there's still a small persistent area of infection.  As such, discussed with her that we would proceed with open hernia repair without mesh, and would do I&D of the abscess.  The risks of bleeding, abscess or infection, injury to surrounding structures, and need for further procedures were all discussed with the patient and was willing to proceed.  Description of Procedure: The patient was correctly identified in the preoperative area and brought into the operating room.  The patient was placed supine with VTE prophylaxis in place.  Appropriate time-outs were performed.  Anesthesia was induced and the patient was intubated.  Appropriate antibiotics were infused.  The abdomen was prepped and draped in a sterile fashion.  An infraumbilical incision was made and cautery was used to dissect down the subcutaneous tissue along the umbilical stalk.  Kelly forceps were used to dissect the umbilical stalk and it was divided at the fascia using cautery.  This allowed visualization of the hernia defect.  The hernia contained preperitoneal fat.  This was dissected and resected without complications.  The fascial edges were cleaned using cautery.  The hernia defect measured approximately 1 cm.  The fascial edges were cleared and the fascia was then closed with multiple 0 Vicryl  sutures.  The skin of the umbilicus was then evaluated and a small abscess was noted. This was incised, revealing small amount of fluid and granulation tissue.  There was also an ingrown hair which may have been the cause of the abscess.  The abscess cavity itself penetrated through the full thickness of the skin.  This was cleaned and irrigated.  The umbilical stalk was then reattached to the fascia using 2-0 Vicryl. The wound was irrigated again and local anesthetic was infused.  The wound was then closed in layers using 3-0 Vicryl and 4-0 Monocryl. The incision was cleaned and sealed with DermaBond.  The umbilicus was packed with 4x4 gauze and secured with tape.  The patient was emerged from anesthesia and extubated and brought to the recovery room for further management.  The patient tolerated the procedure well and all counts were correct at the end of the case.   Aloysius Sheree Plant, MD

## 2023-12-13 NOTE — Anesthesia Procedure Notes (Signed)
 Procedure Name: Intubation Date/Time: 12/13/2023 12:44 PM  Performed by: Lennie Lamarr HERO, CRNAPre-anesthesia Checklist: Patient identified, Emergency Drugs available, Suction available and Patient being monitored Patient Re-evaluated:Patient Re-evaluated prior to induction Oxygen Delivery Method: Circle System Utilized Preoxygenation: Pre-oxygenation with 100% oxygen Induction Type: IV induction Ventilation: Mask ventilation without difficulty Laryngoscope Size: McGrath and 3 Grade View: Grade I Tube type: Oral Tube size: 7.5 mm Number of attempts: 1 Airway Equipment and Method: Stylet, Oral airway and Video-laryngoscopy Placement Confirmation: ETT inserted through vocal cords under direct vision, positive ETCO2 and breath sounds checked- equal and bilateral Secured at: 21 cm Tube secured with: Tape Dental Injury: Teeth and Oropharynx as per pre-operative assessment

## 2023-12-22 ENCOUNTER — Encounter: Payer: Self-pay | Admitting: Surgery

## 2023-12-24 ENCOUNTER — Other Ambulatory Visit: Payer: Self-pay

## 2023-12-24 MED ORDER — FLUCONAZOLE 150 MG PO TABS: 150.0000 mg | ORAL_TABLET | Freq: Every day | ORAL | 1 refills | Status: DC

## 2023-12-26 ENCOUNTER — Encounter: Payer: Self-pay | Admitting: Surgery

## 2023-12-26 ENCOUNTER — Ambulatory Visit (INDEPENDENT_AMBULATORY_CARE_PROVIDER_SITE_OTHER): Admitting: Surgery

## 2023-12-26 VITALS — BP 105/71 | HR 84 | Temp 99.0°F | Ht 63.0 in | Wt 154.0 lb

## 2023-12-26 DIAGNOSIS — K429 Umbilical hernia without obstruction or gangrene: Secondary | ICD-10-CM

## 2023-12-26 DIAGNOSIS — L02216 Cutaneous abscess of umbilicus: Secondary | ICD-10-CM

## 2023-12-26 DIAGNOSIS — Z09 Encounter for follow-up examination after completed treatment for conditions other than malignant neoplasm: Secondary | ICD-10-CM | POA: Diagnosis not present

## 2023-12-26 NOTE — Patient Instructions (Signed)

## 2023-12-26 NOTE — Progress Notes (Signed)
 1

## 2023-12-26 NOTE — Progress Notes (Signed)
 12/26/2023  History of Present Illness: Karen Woodward is a 23 y.o. female s/p open umbilical hernia repair and I&D of skin abscess at the umbilicus on 12/13/23.  Patient had an ingrown hair at the base of the umbilicus which was causing a small abscess.  She presents today for follow-up.  She reports that she has been doing well and reports that her pain has been improving.  Denies any further drainage and there is no further smell at the bellybutton.  Describes some tenderness particularly at the incision itself.  She also reports that she has been having some intermittent nausea or vomiting when eating bigger meals.  She reports that the normal size of the meal that she would eat before surgery, now makes her feel more full and almost gives her nausea and emesis.  Denies any constipation.  Past Medical History: Past Medical History:  Diagnosis Date   Anemia    Dysmenorrhea 05/16/2018   Menometrorrhagia 05/16/2018   Migraine with aura    Morbid obesity with BMI of 40.0-44.9, adult (HCC)    Postpartum hemorrhage    requiring blood transfusions   Preeclampsia    Soft tissue swelling of back 05/16/2018   Umbilical hernia    Vitamin D deficiency      Past Surgical History: Past Surgical History:  Procedure Laterality Date   TONSILLECTOMY AND ADENOIDECTOMY  06/2014    Home Medications: Prior to Admission medications   Not on File    Allergies: Allergies  Allergen Reactions   Nickel Rash    Review of Systems: Review of Systems  Constitutional:  Negative for chills and fever.  Respiratory:  Negative for shortness of breath.   Cardiovascular:  Negative for chest pain.  Gastrointestinal:  Positive for nausea and vomiting.    Physical Exam BP 105/71   Pulse 84   Temp 99 F (37.2 C) (Oral)   Ht 5' 3 (1.6 m)   Wt 154 lb (69.9 kg)   LMP 11/28/2023 (Exact Date)   SpO2 98%   BMI 27.28 kg/m  CONSTITUTIONAL: No acute distress HEENT:  Normocephalic, atraumatic, extraocular  motion intact. RESPIRATORY:  Normal respiratory effort without pathologic use of accessory muscles. CARDIOVASCULAR: Regular rhythm and rate. GI: The abdomen is soft, nondistended, appropriately sore to palpation.  Umbilical incision is healing well and is clean, dry, intact.  Base of the umbilicus is now healed without any drainage or erythema.  NEUROLOGIC:  Motor and sensation is grossly normal.  Cranial nerves are grossly intact. PSYCH:  Alert and oriented to person, place and time. Affect is normal.   Assessment and Plan: This is a 23 y.o. female status post open umbilical hernia repair and I&D of skin abscess.  - Discussed with patient that overall her incision and umbilicus are healing appropriately.  There is no current sign of infection or evidence of hernia recurrence. - Discussed with patient that sometimes with anesthesia narcotics that can cause constipation or increased stool burden throughout the colon.  If she eats a heavy meal that may potentially lead to some of the feeling of fullness and nausea.  Recommended that she start taking MiraLAX once a day for the next week to help clear out some of the intestinal contents to see if this helps her. - Follow-up as needed.  I spent 20 minutes dedicated to the care of this patient on the date of this encounter to include pre-visit review of records, face-to-face time with the patient discussing diagnosis and management, and any  post-visit coordination of care.   Aloysius Sheree Plant, MD [facility]

## 2024-01-08 ENCOUNTER — Encounter: Payer: Self-pay | Admitting: Surgery

## 2024-01-09 ENCOUNTER — Other Ambulatory Visit: Payer: Self-pay

## 2024-01-09 MED ORDER — SULFAMETHOXAZOLE-TRIMETHOPRIM 800-160 MG PO TABS: 1.0000 | ORAL_TABLET | Freq: Two times a day (BID) | ORAL | 0 refills | Status: AC

## 2024-01-09 MED ORDER — SULFAMETHOXAZOLE-TRIMETHOPRIM 400-80 MG PO TABS: 1.0000 | ORAL_TABLET | Freq: Two times a day (BID) | ORAL | 0 refills | Status: DC

## 2024-01-11 ENCOUNTER — Ambulatory Visit: Admitting: Surgery

## 2024-01-11 ENCOUNTER — Encounter: Payer: Self-pay | Admitting: Surgery

## 2024-01-11 VITALS — BP 103/71 | HR 78 | Temp 98.5°F | Ht 63.0 in | Wt 152.8 lb

## 2024-01-11 DIAGNOSIS — L02216 Cutaneous abscess of umbilicus: Secondary | ICD-10-CM

## 2024-01-11 DIAGNOSIS — K429 Umbilical hernia without obstruction or gangrene: Secondary | ICD-10-CM

## 2024-01-11 DIAGNOSIS — Z09 Encounter for follow-up examination after completed treatment for conditions other than malignant neoplasm: Secondary | ICD-10-CM

## 2024-01-11 MED ORDER — FLUCONAZOLE 150 MG PO TABS: 150.0000 mg | ORAL_TABLET | ORAL | 0 refills | Status: AC

## 2024-01-11 NOTE — Patient Instructions (Signed)

## 2024-01-11 NOTE — Progress Notes (Signed)
 01/11/2024  HPI: Karen Woodward is a 23 y.o. female s/p open umbilical hernia repair with I&D of a cutaneous abscess of the umbilicus on 12/13/2023.  Patient presents today for follow-up.  She called this week because she noticed that the right corner of the incision, an open wound with purulent drainage.  She was given a course of Bactrim  an office appointment for today to evaluate the area better.  She has not started the Bactrim  yet.  She noticed last night that a hard material had come off the wound and today this feels better.  Vital signs: BP 103/71   Pulse 78   Temp 98.5 F (36.9 C) (Oral)   Ht 5' 3 (1.6 m)   Wt 152 lb 12.8 oz (69.3 kg)   SpO2 97%   BMI 27.07 kg/m    Physical Exam: Constitutional: No acute distress Abdomen: Soft, nondistended, with mild tenderness in the right corner of the umbilical incision.  The corner has mild erythema and irritation of the wound edges but is currently sealed without any drainage noted.  The hard material that the patient was able to remove appears to be the Monocryl suture knot at the corner.  The base of the umbilicus where prior I&D was done is otherwise healed without any evidence of infection.  Assessment/Plan: This is a 23 y.o. female s/p open umbilical hernia repair with I&D of cutaneous abscess of the umbilicus.  - Discussed with patient that I think what happened is that this skin was pushing of the knot creating a small blistering area that drained fluid.  It is a little bit irritated but now that the suture is out, this should heal on its own.  As a precaution however I would like her to start taking the Bactrim .  She does get yeast infections with antibiotics so I will also prescribe her fluconazole  to take for this. - Return precautions given. - Follow-up as needed.  I spent 10 minutes dedicated to the care of this patient on the date of this encounter to include pre-visit review of records, face-to-face time with the patient  discussing diagnosis and management, and any post-visit coordination of care.  Aloysius Sheree Plant, MD [facility]

## 2024-01-17 ENCOUNTER — Ambulatory Visit: Admitting: Physical Therapy
